# Patient Record
Sex: Female | Born: 1975 | Race: White | Hispanic: No | Smoking: Never smoker
Health system: Southern US, Community
[De-identification: ages and names within clinical notes are randomized; demographics above are authoritative.]

## PROBLEM LIST (undated history)

## (undated) DIAGNOSIS — Z524 Kidney donor: Secondary | ICD-10-CM

## (undated) DIAGNOSIS — K602 Anal fissure, unspecified: Secondary | ICD-10-CM

## (undated) DIAGNOSIS — F419 Anxiety disorder, unspecified: Secondary | ICD-10-CM

## (undated) DIAGNOSIS — F32A Depression, unspecified: Secondary | ICD-10-CM

## (undated) DIAGNOSIS — R011 Cardiac murmur, unspecified: Secondary | ICD-10-CM

## (undated) DIAGNOSIS — K589 Irritable bowel syndrome without diarrhea: Secondary | ICD-10-CM

## (undated) DIAGNOSIS — K648 Other hemorrhoids: Secondary | ICD-10-CM

## (undated) DIAGNOSIS — J45909 Unspecified asthma, uncomplicated: Secondary | ICD-10-CM

## (undated) DIAGNOSIS — G44009 Cluster headache syndrome, unspecified, not intractable: Secondary | ICD-10-CM

## (undated) DIAGNOSIS — F329 Major depressive disorder, single episode, unspecified: Secondary | ICD-10-CM

## (undated) DIAGNOSIS — Z8719 Personal history of other diseases of the digestive system: Secondary | ICD-10-CM

## (undated) HISTORY — PX: KIDNEY DONATION: SHX685

## (undated) HISTORY — DX: Unspecified asthma, uncomplicated: J45.909

## (undated) HISTORY — PX: TUBAL LIGATION: SHX77

## (undated) HISTORY — PX: CHOLECYSTECTOMY: SHX55

## (undated) HISTORY — DX: Major depressive disorder, single episode, unspecified: F32.9

## (undated) HISTORY — PX: KNEE SURGERY: SHX244

## (undated) HISTORY — DX: Kidney donor: Z52.4

## (undated) HISTORY — DX: Cardiac murmur, unspecified: R01.1

## (undated) HISTORY — DX: Irritable bowel syndrome without diarrhea: K58.9

## (undated) HISTORY — DX: Other hemorrhoids: K64.8

## (undated) HISTORY — DX: Depression, unspecified: F32.A

## (undated) HISTORY — DX: Cluster headache syndrome, unspecified, not intractable: G44.009

## (undated) HISTORY — PX: COLONOSCOPY: SHX174

## (undated) HISTORY — PX: APPENDECTOMY: SHX54

## (undated) HISTORY — DX: Anal fissure, unspecified: K60.2

## (undated) HISTORY — DX: Personal history of other diseases of the digestive system: Z87.19

---

## 1997-11-23 ENCOUNTER — Ambulatory Visit (HOSPITAL_COMMUNITY): Admission: RE | Admit: 1997-11-23 | Discharge: 1997-11-23 | Payer: Self-pay | Admitting: Obstetrics

## 1998-01-02 ENCOUNTER — Ambulatory Visit (HOSPITAL_COMMUNITY): Admission: RE | Admit: 1998-01-02 | Discharge: 1998-01-02 | Payer: Self-pay | Admitting: Obstetrics

## 1998-01-14 ENCOUNTER — Inpatient Hospital Stay (HOSPITAL_COMMUNITY): Admission: AD | Admit: 1998-01-14 | Discharge: 1998-01-14 | Payer: Self-pay | Admitting: Obstetrics

## 1998-01-30 ENCOUNTER — Other Ambulatory Visit: Admission: RE | Admit: 1998-01-30 | Discharge: 1998-01-30 | Payer: Self-pay | Admitting: *Deleted

## 1998-04-15 ENCOUNTER — Inpatient Hospital Stay (HOSPITAL_COMMUNITY): Admission: AD | Admit: 1998-04-15 | Discharge: 1998-04-15 | Payer: Self-pay | Admitting: *Deleted

## 1998-04-16 ENCOUNTER — Inpatient Hospital Stay (HOSPITAL_COMMUNITY): Admission: RE | Admit: 1998-04-16 | Discharge: 1998-04-16 | Payer: Self-pay | Admitting: Obstetrics & Gynecology

## 1998-04-30 ENCOUNTER — Inpatient Hospital Stay (HOSPITAL_COMMUNITY): Admission: AD | Admit: 1998-04-30 | Discharge: 1998-05-02 | Payer: Self-pay | Admitting: Obstetrics

## 1999-06-03 ENCOUNTER — Other Ambulatory Visit: Admission: RE | Admit: 1999-06-03 | Discharge: 1999-06-03 | Payer: Self-pay | Admitting: Gynecology

## 1999-10-30 ENCOUNTER — Inpatient Hospital Stay (HOSPITAL_COMMUNITY): Admission: AD | Admit: 1999-10-30 | Discharge: 1999-10-30 | Payer: Self-pay | Admitting: Obstetrics & Gynecology

## 1999-11-08 ENCOUNTER — Encounter: Admission: RE | Admit: 1999-11-08 | Discharge: 2000-02-06 | Payer: Self-pay | Admitting: Obstetrics & Gynecology

## 2000-01-19 ENCOUNTER — Inpatient Hospital Stay (HOSPITAL_COMMUNITY): Admission: AD | Admit: 2000-01-19 | Discharge: 2000-01-22 | Payer: Self-pay | Admitting: Obstetrics and Gynecology

## 2001-07-10 ENCOUNTER — Inpatient Hospital Stay (HOSPITAL_COMMUNITY): Admission: AD | Admit: 2001-07-10 | Discharge: 2001-07-14 | Payer: Self-pay | Admitting: *Deleted

## 2001-08-12 ENCOUNTER — Encounter: Admission: RE | Admit: 2001-08-12 | Discharge: 2001-09-22 | Payer: Self-pay | Admitting: Orthopedic Surgery

## 2002-01-07 ENCOUNTER — Encounter: Admission: RE | Admit: 2002-01-07 | Discharge: 2002-01-07 | Payer: Self-pay | Admitting: Family Medicine

## 2002-01-07 ENCOUNTER — Encounter: Payer: Self-pay | Admitting: Family Medicine

## 2002-01-19 ENCOUNTER — Ambulatory Visit (HOSPITAL_COMMUNITY): Admission: RE | Admit: 2002-01-19 | Discharge: 2002-01-19 | Payer: Self-pay | Admitting: Internal Medicine

## 2002-01-24 ENCOUNTER — Encounter: Payer: Self-pay | Admitting: *Deleted

## 2002-01-24 ENCOUNTER — Encounter: Admission: RE | Admit: 2002-01-24 | Discharge: 2002-01-24 | Payer: Self-pay | Admitting: *Deleted

## 2002-12-08 ENCOUNTER — Emergency Department (HOSPITAL_COMMUNITY): Admission: EM | Admit: 2002-12-08 | Discharge: 2002-12-08 | Payer: Self-pay | Admitting: Emergency Medicine

## 2002-12-08 ENCOUNTER — Encounter: Payer: Self-pay | Admitting: Emergency Medicine

## 2003-01-14 ENCOUNTER — Encounter: Admission: RE | Admit: 2003-01-14 | Discharge: 2003-01-14 | Payer: Self-pay | Admitting: Family Medicine

## 2003-01-14 ENCOUNTER — Encounter: Payer: Self-pay | Admitting: Family Medicine

## 2003-07-05 ENCOUNTER — Emergency Department (HOSPITAL_COMMUNITY): Admission: EM | Admit: 2003-07-05 | Discharge: 2003-07-05 | Payer: Self-pay | Admitting: Emergency Medicine

## 2005-02-06 ENCOUNTER — Emergency Department (HOSPITAL_COMMUNITY): Admission: EM | Admit: 2005-02-06 | Discharge: 2005-02-06 | Payer: Self-pay | Admitting: Emergency Medicine

## 2005-05-24 ENCOUNTER — Emergency Department (HOSPITAL_COMMUNITY): Admission: EM | Admit: 2005-05-24 | Discharge: 2005-05-25 | Payer: Self-pay | Admitting: Emergency Medicine

## 2005-06-09 ENCOUNTER — Encounter (INDEPENDENT_AMBULATORY_CARE_PROVIDER_SITE_OTHER): Payer: Self-pay | Admitting: *Deleted

## 2005-06-09 ENCOUNTER — Ambulatory Visit (HOSPITAL_COMMUNITY): Admission: RE | Admit: 2005-06-09 | Discharge: 2005-06-09 | Payer: Self-pay | Admitting: *Deleted

## 2006-01-26 ENCOUNTER — Emergency Department (HOSPITAL_COMMUNITY): Admission: EM | Admit: 2006-01-26 | Discharge: 2006-01-26 | Payer: Self-pay | Admitting: Family Medicine

## 2007-04-18 ENCOUNTER — Emergency Department (HOSPITAL_COMMUNITY): Admission: EM | Admit: 2007-04-18 | Discharge: 2007-04-18 | Payer: Self-pay | Admitting: Emergency Medicine

## 2008-01-25 ENCOUNTER — Ambulatory Visit: Payer: Self-pay | Admitting: Nurse Practitioner

## 2008-01-25 DIAGNOSIS — Z9889 Other specified postprocedural states: Secondary | ICD-10-CM | POA: Insufficient documentation

## 2008-01-25 DIAGNOSIS — N760 Acute vaginitis: Secondary | ICD-10-CM | POA: Insufficient documentation

## 2008-01-25 DIAGNOSIS — R3129 Other microscopic hematuria: Secondary | ICD-10-CM | POA: Insufficient documentation

## 2008-01-25 LAB — CONVERTED CEMR LAB
Bilirubin Urine: NEGATIVE
KOH Prep: NEGATIVE
Ketones, urine, test strip: NEGATIVE
Specific Gravity, Urine: >=1.03
Urobilinogen, UA: 0.2

## 2008-01-26 ENCOUNTER — Encounter (INDEPENDENT_AMBULATORY_CARE_PROVIDER_SITE_OTHER): Payer: Self-pay | Admitting: Nurse Practitioner

## 2008-01-28 ENCOUNTER — Telehealth (INDEPENDENT_AMBULATORY_CARE_PROVIDER_SITE_OTHER): Payer: Self-pay | Admitting: Nurse Practitioner

## 2008-02-18 ENCOUNTER — Ambulatory Visit: Payer: Self-pay | Admitting: Nurse Practitioner

## 2008-02-18 DIAGNOSIS — N739 Female pelvic inflammatory disease, unspecified: Secondary | ICD-10-CM | POA: Insufficient documentation

## 2008-02-18 LAB — CONVERTED CEMR LAB
Glucose, Urine, Semiquant: NEGATIVE
Pap Smear: NEGATIVE
Specific Gravity, Urine: 1.015
pH: 7

## 2008-02-21 ENCOUNTER — Encounter (INDEPENDENT_AMBULATORY_CARE_PROVIDER_SITE_OTHER): Payer: Self-pay | Admitting: Nurse Practitioner

## 2008-02-21 LAB — CONVERTED CEMR LAB
ALT: 14 units/L (ref 0–35)
Alkaline Phosphatase: 50 units/L (ref 39–117)
Basophils Absolute: 0 10*3/uL (ref 0.0–0.1)
Eosinophils Absolute: 0 10*3/uL (ref 0.0–0.7)
Eosinophils Relative: 0 % (ref 0–5)
Glucose, Bld: 81 mg/dL (ref 70–99)
HCT: 46.2 % — ABNORMAL HIGH (ref 36.0–46.0)
LDL Cholesterol: 98 mg/dL (ref 0–99)
MCV: 90.1 fL (ref 78.0–100.0)
Platelets: 191 10*3/uL (ref 150–400)
RDW: 13.3 % (ref 11.5–15.5)
Sodium: 141 meq/L (ref 135–145)
Total Bilirubin: 0.5 mg/dL (ref 0.3–1.2)
Total Protein: 7 g/dL (ref 6.0–8.3)
Triglycerides: 116 mg/dL (ref <150)
VLDL: 23 mg/dL (ref 0–40)

## 2008-02-24 ENCOUNTER — Ambulatory Visit: Payer: Self-pay | Admitting: Nurse Practitioner

## 2008-02-24 DIAGNOSIS — D751 Secondary polycythemia: Secondary | ICD-10-CM | POA: Insufficient documentation

## 2008-02-28 ENCOUNTER — Encounter (INDEPENDENT_AMBULATORY_CARE_PROVIDER_SITE_OTHER): Payer: Self-pay | Admitting: Nurse Practitioner

## 2008-04-19 ENCOUNTER — Ambulatory Visit: Payer: Self-pay | Admitting: Nurse Practitioner

## 2008-04-19 DIAGNOSIS — R109 Unspecified abdominal pain: Secondary | ICD-10-CM | POA: Insufficient documentation

## 2008-04-19 LAB — CONVERTED CEMR LAB
Blood in Urine, dipstick: NEGATIVE
KOH Prep: NEGATIVE
Ketones, urine, test strip: NEGATIVE
Urobilinogen, UA: 0.2

## 2008-04-24 ENCOUNTER — Encounter (INDEPENDENT_AMBULATORY_CARE_PROVIDER_SITE_OTHER): Payer: Self-pay | Admitting: Nurse Practitioner

## 2008-04-24 ENCOUNTER — Ambulatory Visit (HOSPITAL_COMMUNITY): Admission: RE | Admit: 2008-04-24 | Discharge: 2008-04-24 | Payer: Self-pay | Admitting: Family Medicine

## 2008-05-31 ENCOUNTER — Ambulatory Visit: Payer: Self-pay | Admitting: Nurse Practitioner

## 2008-05-31 DIAGNOSIS — N899 Noninflammatory disorder of vagina, unspecified: Secondary | ICD-10-CM | POA: Insufficient documentation

## 2008-05-31 DIAGNOSIS — B372 Candidiasis of skin and nail: Secondary | ICD-10-CM | POA: Insufficient documentation

## 2008-05-31 LAB — CONVERTED CEMR LAB
Bilirubin Urine: NEGATIVE
Blood in Urine, dipstick: NEGATIVE
Glucose, Urine, Semiquant: NEGATIVE
KOH Prep: NEGATIVE
Ketones, urine, test strip: NEGATIVE
WBC Urine, dipstick: NEGATIVE

## 2008-11-08 ENCOUNTER — Ambulatory Visit: Payer: Self-pay | Admitting: Nurse Practitioner

## 2008-11-08 DIAGNOSIS — S335XXA Sprain of ligaments of lumbar spine, initial encounter: Secondary | ICD-10-CM | POA: Insufficient documentation

## 2008-11-08 DIAGNOSIS — J45990 Exercise induced bronchospasm: Secondary | ICD-10-CM | POA: Insufficient documentation

## 2008-11-08 LAB — CONVERTED CEMR LAB
Bilirubin Urine: NEGATIVE
Glucose, Urine, Semiquant: NEGATIVE
KOH Prep: NEGATIVE
Ketones, urine, test strip: NEGATIVE
Specific Gravity, Urine: >=1.03

## 2009-06-15 ENCOUNTER — Encounter (INDEPENDENT_AMBULATORY_CARE_PROVIDER_SITE_OTHER): Payer: Self-pay | Admitting: Nurse Practitioner

## 2009-09-03 ENCOUNTER — Encounter: Admission: RE | Admit: 2009-09-03 | Discharge: 2009-10-24 | Payer: Self-pay | Admitting: Orthopedic Surgery

## 2010-06-23 ENCOUNTER — Emergency Department (HOSPITAL_COMMUNITY): Admission: EM | Admit: 2010-06-23 | Discharge: 2010-06-23 | Payer: Self-pay | Admitting: Emergency Medicine

## 2010-10-07 ENCOUNTER — Inpatient Hospital Stay (INDEPENDENT_AMBULATORY_CARE_PROVIDER_SITE_OTHER)
Admission: RE | Admit: 2010-10-07 | Discharge: 2010-10-07 | Disposition: A | Discharge status: Home / Self Care | Payer: Self-pay | Source: Ambulatory Visit | Attending: Emergency Medicine | Admitting: Emergency Medicine

## 2010-10-07 DIAGNOSIS — A499 Bacterial infection, unspecified: Secondary | ICD-10-CM

## 2010-10-07 DIAGNOSIS — N760 Acute vaginitis: Secondary | ICD-10-CM

## 2010-10-07 LAB — POCT URINALYSIS DIPSTICK
Protein, ur: NEGATIVE mg/dL
Urobilinogen, UA: 0.2 mg/dL (ref 0.0–1.0)
pH: 5.5 (ref 5.0–8.0)

## 2010-10-07 LAB — WET PREP, GENITAL

## 2010-10-07 LAB — POCT PREGNANCY, URINE: Preg Test, Ur: NEGATIVE

## 2010-10-10 LAB — GC/CHLAMYDIA PROBE AMP, GENITAL
Chlamydia, DNA Probe: NEGATIVE
GC Probe Amp, Genital: NEGATIVE

## 2010-10-16 LAB — URINE CULTURE: Colony Count: >=100000

## 2010-10-16 LAB — URINALYSIS, ROUTINE W REFLEX MICROSCOPIC
Bilirubin Urine: NEGATIVE
Hgb urine dipstick: NEGATIVE
Nitrite: NEGATIVE
Protein, ur: NEGATIVE mg/dL
Specific Gravity, Urine: 1.019 (ref 1.005–1.030)
Urobilinogen, UA: 0.2 mg/dL (ref 0.0–1.0)

## 2010-10-16 LAB — URINE MICROSCOPIC-ADD ON

## 2010-10-16 LAB — POCT PREGNANCY, URINE: Preg Test, Ur: NEGATIVE

## 2010-11-01 ENCOUNTER — Inpatient Hospital Stay (INDEPENDENT_AMBULATORY_CARE_PROVIDER_SITE_OTHER)
Admission: RE | Admit: 2010-11-01 | Discharge: 2010-11-01 | Disposition: A | Discharge status: Home / Self Care | Payer: Self-pay | Source: Ambulatory Visit | Attending: Family Medicine | Admitting: Family Medicine

## 2010-11-01 DIAGNOSIS — N760 Acute vaginitis: Secondary | ICD-10-CM

## 2010-11-01 DIAGNOSIS — A499 Bacterial infection, unspecified: Secondary | ICD-10-CM

## 2010-11-01 LAB — POCT URINALYSIS DIP (DEVICE)
Glucose, UA: NEGATIVE mg/dL
Hgb urine dipstick: NEGATIVE
Ketones, ur: 40 mg/dL — AB
Nitrite: NEGATIVE
Protein, ur: NEGATIVE mg/dL
Specific Gravity, Urine: >=1.03 (ref 1.005–1.030)
Urobilinogen, UA: 1 mg/dL (ref 0.0–1.0)
pH: 6 (ref 5.0–8.0)

## 2010-11-01 LAB — URINALYSIS, MICROSCOPIC ONLY
Glucose, UA: NEGATIVE mg/dL
Hgb urine dipstick: NEGATIVE
Ketones, ur: 40 mg/dL — AB
Protein, ur: NEGATIVE mg/dL
Urobilinogen, UA: 1 mg/dL (ref 0.0–1.0)

## 2010-11-01 LAB — POCT PREGNANCY, URINE: Preg Test, Ur: NEGATIVE

## 2010-12-20 NOTE — Discharge Summary (Signed)
[facility]  Patient:    Monica Stein, Monica Stein Visit Number: [id number] MRN: [id number]          Service Type: PSY Location: 500 0505 01 Attending Physician:  Jeanice Lim Dictated by:   Jeanice Lim, M.D. Admit Date:  07/10/2001 Discharge Date: 07/14/2001                             Discharge Summary  IDENTIFYING DATA:  This is a 35 year old married Caucasian female voluntarily admitted for anxiety, irritability, and feeling out of control as if she might injure people close to her.  She felt like pushing her 23-year-old child and poking the child with a vacuum cleaner.  The patient denied a history of physical abuse to her children.  PAST PSYCHIATRIC HISTORY:  Followed by Dr. Gwyndolyn Kaufman.  This is her first hospitalization to [facility].  MEDICATIONS: 1. Neurontin. 2. Effexor. 3. Doxepin.  The patient had been on Paxil and Zoloft in the past and found them ineffective.  PHYSICAL EXAMINATION:  GENERAL:  Essentially within normal limits except for obesity.  NEUROLOGIC:  Nonfocal.  ROUTINE ADMISSION LABORATORY DATA:  Essentially within normal limits including CBC, CMET, urine pregnancy test was negative.  MENTAL STATUS EXAMINATION:  The patient was an alert, young middle-aged, overweight Caucasian female, casually dressed.  Speech: Within normal limits and no pressure.  Mood: Depressed and anxious.  Affect: Sad and anxious. Thought process: Goal directed.  Thought content: Negative for psychotic symptoms, no suicidal or homicidal ideation.  Cognitive: Intact.  ADMITTING DIAGNOSES: Axis I:    Major depression, recurrent, moderate.  HOSPITAL COURSE:  The patient was restarted on psychotropics and Effexor was tapered.  Depakote was started and titrated along with Risperdal for mood lability and Neurontin was decreased.  The patient was given Bactrim for possible UTI and she tolerated medications well without side effects.  CONDITION AT DISCHARGE:   Markedly improved.  Mood was more stable, euthymic. Affect: Bright.  Thought process: Goal directed.  Thought content: Negative for psychotic symptoms or dangerous ideation.  The patient felt like she was in control and would be able to manage her behavior safely and care for her children, dealing with psychosocial stressors in a more healthy manner.  DISCHARGE MEDICATIONS: 1. Effexor XR 75 mg b.i.d. 2. Bactrim double strength b.i.d. for five days. 3. Risperdal 0.25 mg q.a.m., 3 p.m., and two q.h.s. 4. Depakote 250 mg three q.h.s. 5. Neurontin 100 mg two q.a.m., 3 p.m., and q.h.s.  [facility] December 13 at 10:30 a.m.  DISCHARGE DIAGNOSES: Axis I:    Major depression, recurrent, moderate. Axis V:    Global assessment of functioning on discharge was 55. Dictated by:   Jeanice Lim, M.D. Attending Physician:  Jeanice Lim DD:  10/13/01 TD:  10/15/01 Job: [id number]

## 2010-12-20 NOTE — Discharge Summary (Signed)
[facility]  Patient:    Monica Stein, Monica Stein                       MRN: [id number] Adm. Date:  [id number] Disc. Date: 01/22/00 Attending:  Miguel Aschoff Dictator:   Leilani Able, P.A.                           Discharge Summary  FINAL DIAGNOSES:              1. Spontaneous vaginal delivery of a term female                                  infant with Apgars of 9 and 9.                               2. Postpartum tubal ligation.  Delivery performed by Miguel Aschoff, M.D.  Postpartum tubal ligation procedure performed by Conley Simmonds, M.D.  COMPLICATIONS:                None.  HISTORY OF PRESENT ILLNESS:   This 35 year old, gravida 3, para 2-0-0-2, presents at 38 weeks complaining of decreased fetal movement.  Her strip was reactive and she was noted to be having regular contractions every three to five minutes apart.  HOSPITAL COURSE:              She is admitted at this time in the early latent phase of labor.  She progressed to complete and complete spontaneously and she pushed and spontaneously delivered a 7 pound 15 ounce female infant with Apgars 9 and 9.  Delivery went without complications.  There were no lacerations.  The patient sitll expressed her desire for a postpartum sterilization procedure.  She was posted for later on the day of June 18.  The patient was taken to the operating  room around 1:30 p.m. by Conley Simmonds, M.D. where a bilateral postpartum tubal ligation procedure was performed using the modified Pomeroy technique.  The procedure went without complications.  The patients postpartum and postoperative course were benign without significant fevers.  She was felt ready for discharge on postpartum day #2.  She was sent home on a regular diet, told to decrease activities, was given Tylox #30 one to two every four hours as needed for pain, and told she could use over-the-counter pain medicines as well and follow up in the  office  in four weeks.  DISCHARGE LABORATORY DATA:    Her hemoglobin was 12.5, white blood cell count 7.3. The patient did not need RhoGAM because infant was also rh negative. DD:  01/22/00 TD:  01/22/00 Job: [id number]

## 2010-12-20 NOTE — Op Note (Signed)
[facility]  Patient:    Monica Stein, Monica Stein                       MRN: [id number] Proc. Date: 01/20/00 Adm. Date:  [id number] Attending:  Miguel Aschoff                           Operative Report  PREOPERATIVE DIAGNOSIS:       Multiparous female, desire for permanent sterilization.  Status post normal spontaneous vaginal delivery of a viable female infant on January 20, 2000.  POSTOPERATIVE DIAGNOSIS:      Multiparous female, desire for permanent sterilization.  Status post normal spontaneous vaginal delivery of a viable female infant on January 20, 2000.  OPERATION:                    Postpartum bilateral tubal ligation by the modified Pomeroy technique.  SURGEON:                      Conley Simmonds, M.D.  ASSISTANT:  ANESTHESIA:                   General endotracheal anesthesia.  IV FLUIDS:                    1200 cc Crystalloid.  ESTIMATED BLOOD LOSS:         Minimal.  URINE OUTPUT:                 900 cc.  COMPLICATIONS:                None.  INDICATIONS:                  The patient was a 35 year old, gravida 3, para 2-0-0-2, status post spontaneous vaginal delivery of a viable female infant on une 18, 2001, who requested postpartum tubal ligation.  The patient was interested n permanent sterilization.  A decision was made to proceed with a postpartum tubal ligation after the risks and benefits were reviewed.  The patient was quoted a failure rate of approximately 1 in 250 to 1 in 300 which may result in either an intrauterine pregnancy or an ectopic pregnancy.  FINDINGS:                     Demonstrated a normal postpartum uterus.  The fallopian tubes were normal bilaterally.  The right ovary was noted to be normal, and the right ovary was not palpable nor visible.  SPECIMENS:                    A portion of the right and left fallopian tubes were sent to pathology.  DESCRIPTION OF PROCEDURE:     With an IV in place, the patient was  escorted to he operating suite after she was properly identified.  The patient did receive Ancef 1 gram intravenously for antibiotic prophylaxis.  In the operating suite, the patient was placed in the supine position and general endotracheal anesthesia was induced.  The abdomen and the perineum were sterilely prepped, and a Foley catheter was placed inside the urinary bladder.  The patient was then sterilely draped.  The procedure began with an infraumbilical transverse incision which measured approximately 3 cm.  This was carried down to the subcutaneous tissue with a scalpel.  Blunt dissection with a kelly clamp  was used to reach the fascia which was then grasped with two Kocher clamps.  The fascia was then incised in a transverse fashion.  The parietoperitoneum was grasped with two snap clamps, and the peritoneal cavity was entered sharply with the Metzenbaum scissors. Digital examination through the incision demonstrated no evidence of any intra-abdominal adhesions.  A moistened lap pad was then placed inside the peritoneal cavity, and retractors were used to identify the left fallopian tube which was grasped with a Babcock clamp.  It was followed all the way to its fimbriated end.  A knuckle of tissue was then created with a suture of 0 plain.  A snap clamp was used to come through the mesosalpinx, and an additional piece of suture was tied around the base of each  knuckle of tissue created from the 0 plain gut suture.  The intervening portion of fallopian tube was then excised and sent to pathology.  There was no evidence of any bleeding at the operative site.  The same procedure that was performed on the patients left hand side was then repeated on the right hand side.  After that fallopian tube was followed all the way to its fimbriated end.  Again hemostasis was excellent on this side.  The moistened lap pad was removed from the peritoneal cavity.  The fascia  was closed with a running suture of 0 Vicryl, and the skin as closed with a subcuticular suture of 4-0 Vicryl.  Steri-Strips  and Benzoin were placed over the incision, and this was followed by a sterile dressing.  The patient was extubated and escorted to the recovery room in stable and awake  condition.  There were no complications to the procedure.  All sponge, needle, nd instrument counts were correct. DD:  01/20/00 TD:  01/22/00 Job: [id number]

## 2010-12-20 NOTE — H&P (Signed)
[facility]  Patient:    Monica Stein, Monica Stein Visit Number: [id number] MRN: [id number]          Service Type: PSY Location: 500 0505 01 Attending Physician:  Denny Peon Dictated by:   Candi Leash. Orsini, N.P. Admit Date:  07/10/2001                     Psychiatric Admission Assessment  DATE OF ADMISSION:  July 10, 2001  PATIENT IDENTIFICATION:  This is a 35 year old married white female who was voluntarily admitted on July 10, 2001, for anxiety and irritability.  HISTORY OF PRESENT ILLNESS:  The patient presents with a history of anxiety and irritability for the past few days.  The patient, on the day of admission, reported that she was playing with her children and suddenly became very anxious and irritable.  She began to vacuum.  She was pushing her 77-year-old with the vacuum cleaner.  There was no history of injury to the baby.  She reported she had never done anything like that before.  She does state that on occasion she had taken her 35-year-old and "shaken him" when she has been upset.  She has been experiencing nightmares where she feels her middle child has died and has stuffed him in a drawer so that no one will take him.  She reports mood swings, decreased sleep, often not going to bed until 5 oclock in the morning, taking down her Christmas decorations.  Appetite has increased with some questionable weight gain.  She denied any psychosis, no suicidal or homicidal ideation.  The patient is stressed over her husbands unemployment for the past four months.  PAST PSYCHIATRIC HISTORY:  The patient sees Dr. Gwyndolyn Kaufman; her last visit was in October.  This is her first hospitalization at [facility].  She sees Champ Mungo, her therapist.  The patient has had no prior suicide attempts.  SUBSTANCE ABUSE HISTORY:  Denies any alcohol or substance abuse.  PAST MEDICAL HISTORY:  Primary care [facility]: The patient attends  [facility].  Medical problems: Exercise-induced asthma.  MEDICATIONS: 1. Neurontin 300 mg at noon and h.s. 2. Effexor XR 75 mg two in the morning and one at h.s. 3. Doxepin 75 mg q.h.s.; has been on this medication for approximately one    month.  The patient has been on Paxil and Zoloft in the past and has found it ineffective.  REVIEW OF SYSTEMS:  The patient denies any fever or chills.  Has had some increased change in appetite and some weight gain of an undetermined amount. Wears glasses, no blurred or double vision.  No hearing loss.  No sinus pain. Occasional chest pain with anxiety.  No history of hypertension or arrhythmias.  The patient is a nonsmoker, no cough or shortness of breath. GI: No change in habits, no constipation or diarrhea.  GU: No dysuria, frequency, or hematuria.  MUSCULOSKELETAL: No stiffness, swelling, or joint pain.  SKIN: No itching, wound, or rash.  No weakness, tremor, or numbness. The patient has a history of stress headaches.  PSYCHIATRIC: Recent history of depression, irritability, and anxiety.  ENDOCRINE: No thyroid or diabetic symptoms.  No enlarged or tender nodes.  No history of anemia.  No environmental allergies.  PHYSICAL EXAMINATION:  VITAL SIGNS:  The patient is 195 pounds.  She is 5 feet 6 inches tall.  Last vital signs: Temperature 99.2, pulse 89 respirations 18, blood pressure 119/77.  GENERAL:  The patient  is a 35 year old Caucasian female in no acute distress. She appears her stated age, overweight.  She is well-groomed, alert, and cooperative.  HEENT:  Head is normocephalic.  She can raise her eyebrows.  Hair is short and evenly distribution.  EOMs are intact bilaterally.  External ear canals are patent.  No sinus tenderness, no nasal discharge.  Good dentition, no lesions were seen.  Tongue protrudes to midline without tremor.  NECK:  Supple, no JVD, negative lymphadenopathy.  Thyroid is nonpalpable  and nontender.  Trachea is midline.  CHEST:  Clear to auscultation, no adventitious sounds, no cough.  CARDIOVASCULAR:  Heart is regular rate and rhythm without murmurs, rubs, or gallops.  Carotid pulses are equal and adequate.  BREAST:  Exam was deferred.  ABDOMEN:  Soft, nontender abdomen, no CVA tenderness.  MUSCULOSKELETAL:  No joint swelling or deformity.  Good range of motion. Muscle strength and tone is equal bilaterally.  No signs of injury.  SKIN:  Warm and dry with good turgor.  Nail beds are pink with good capillary refill.  Nail beds are clean.  Strong bilateral radial pulses.  NEUROLOGIC:  Oriented x 3.  Cranial nerves are grossly intact.  Good grip strength bilaterally.  No involuntary movements.  Gait is normal.  Cerebellar function is intact with heel-to-shin and normal alternating movements. Romberg is negative.  LABORATORY DATA:  CBC was within normal limits.  CMET was within normal limits.  Urine pregnancy test was negative.  Urinalysis was cloudy with many bacteria.  SOCIAL HISTORY:  She is a 35 year old married white female, married for six years.  She has three children ages 63, 31, and 1.  She lives with her husband and children.  She is a housewife.  She completed the 12th grade.  She has no legal problems.  FAMILY HISTORY:  Mother with bipolar disorder, is on medications.  MENTAL STATUS EXAMINATION:  She is an alert, young, middle-aged, overweight white female, casually dressed.  Speech is normal and relevant, not pressured. Mood is depressed and anxious.  Affect is sad and anxious.  Thought processes are coherent.  No evidence of psychosis, no auditory or visual hallucinations, no suicidal or homicidal ideations, no paranoia.  Cognitive: Intact.  Judgment is fair.  Insight is fair.  Alert and oriented x 4.  ADMISSION DIAGNOSES: Axis I:    1. Mood disorder, not otherwise specified.            2. Rule out bipolar disorder. Axis II:   Deferred. Axis  III:  Exercise-induced asthma. Axis IV:   Problems with primary support group and occupation.  Axis V:    Current is 35, estimated this past year is 37.  INITIAL PLAN OF CARE:  Plan is a voluntary admission to [facility] for depression, anxiety, irritability, and questionable obsessive thoughts.  Contract for safety.  Check every 15 minutes.  The patient agrees to be safe.  Will resume her routine medications.  Will obtain further labs. Will have a family session with her husband.  Case worker will call Child Protective Services.  Consult with Dr. Lourdes Sledge in regard to her medication regime.  Goal is to stabilize her mood and thinking so the patient can be safe, to follow up with Dr. Gwyndolyn Kaufman.  ESTIMATED LENGTH OF STAY:  Three to five days. Dictated by:   Candi Leash. Orsini, N.P. Attending Physician:  Denny Peon DD:  07/11/01 TD:  07/12/01 Job: [id number]

## 2010-12-20 NOTE — Op Note (Signed)
Monica Stein, Monica Stein                ACCOUNT NO.:  [id number]   MEDICAL RECORD NO.:  [id number]          PATIENT TYPE:  AMB   LOCATION:  DAY                          [facility]AC[facility]   PHYSICIAN:  Vikki Ports, MDDATE OF BIRTH:  1976/05/25   DATE OF PROCEDURE:  06/09/2005  DATE OF DISCHARGE:                                 OPERATIVE REPORT   PREOPERATIVE DIAGNOSIS:  Symptomatic cholelithiasis.   POSTOPERATIVE DIAGNOSIS:  Symptomatic cholelithiasis.   PROCEDURE:  Laparoscopic cholecystectomy.   SURGEON:  Vikki Ports, MD   ASSISTANT:  Lebron Conners, M.D.   ANESTHESIA:  General.   DESCRIPTION:  The patient was taken to the operating room and placed in a  supine position.  After adequate general anesthesia was induced using  endotracheal tube, the abdomen was prepped and draped in a normal sterile  fashion.  Using a small vertical supraumbilical incision, I dissected down  to the fascia and opened it vertically.  A #1 Vicryl purse-string suture was  placed around the fascial defect.  A Hasson trocar was placed in the  abdomen, and the abdomen was insufflated in continuous-flow carbon dioxide.  Under direct visualization, a 10 mm trocar was placed in the subxiphoid  region.  Two 5 mm trocars were placed in the right abdomen.  The gallbladder  was identified and retracted cephalad.  A routine dissection was performed,  identifying the cystic duct in the triangle of Calot.  The cystic duct  junction with the gallbladder and common duct were verified, and the cystic  duct was triply clipped and divided, as was the cystic artery in a similar  fashion.  The gallbladder was then taken off the gallbladder bed using Bovie  electrocautery, placed in an EndoCatch bag when there was a small hole made  in the posterior wall.  It was removed through the umbilical port.  Right  upper quadrant was copiously irrigated.  Adequate hemostasis was insured.  Pneumoperitoneum was  released.  The trocars were removed.  Fascial defect  was closed with the previously placed Vicryl sutures.  The skin incisions  were closed with subcuticular 4-0 Monocryl.  Steri-Strips and sterile  dressings were applied.  The patient tolerated the procedure well and went  to the PACU in good condition.      Vikki Ports, MD  Electronically Signed     KRH/MEDQ  D:  06/09/2005  T:  06/09/2005  Job:  [id number]

## 2010-12-20 NOTE — Discharge Summary (Signed)
[facility]  Patient:    Monica Stein, Monica Stein Visit Number: [id number] MRN: [id number]          Service Type: PSY Location: 500 0505 01 Attending Physician:  Jeanice Lim Dictated by:   Jeanice Lim, M.D. Admit Date:  07/10/2001 Discharge Date: 07/14/2001                             Discharge Summary  IDENTIFYING DATA:  This is a 35 year old married Caucasian female voluntarily admitted for anxiety and irritability and became so irritable on the day of admission she was feeling as if she may hurt her child, which she has never done before.  She began experiencing nightmares where she feels her middle child has died and has stuffed him in a drawer so that no one will take him.  PAST PSYCHIATRIC HISTORY:  The patient sees Dr. Gwyndolyn Kaufman.  MEDICATIONS:  Neurontin, Effexor, doxepin.  The patient has been on Paxil and Zoloft in the past.  PHYSICAL EXAMINATION:  Essentially within normal limits.  Neurologically nonfocal.  MENTAL STATUS EXAMINATION:  The patient was alert and oriented, casually dressed.  Speech within normal limits.  Mood depressed and anxious.  Affect sad.  Thought processes goal directed.  Thought content negative for psychotic symptoms.  She denied suicidal or homicidal ideation.  Admitted to extreme irritability with feeling of losing control at times.  Cognitively was intact. Judgment and insight were considered fair.  ADMISSION DIAGNOSES: Axis I:    Possible bipolar disorder, type 2, versus major depressive            disorder. Axis II:   None. Axis III:  Exercise-induced asthma. Axis IV:   Moderate (problems with primary support). Axis V:    35/69.  HOSPITAL COURSE:  The patient was admitted and ordered routine p.r.n. medications and titrated on Effexor and doxepin.  Depakote was started as well as Risperdal and Neurontin also adjusted.  The patient reported good tolerance to these medication changes with no side effects and  felt better.  CONDITION ON DISCHARGE:  Improved.  Mood was more stable and euthymic.  Affect brighter.  Thought process goal directed.  Thought content negative for fear of hurting her children.  No suicidal or homicidal or violent ideation nor psychotic symptoms.  Judgment and insight improved.  She reported feeling safe and was motivated to follow up with the treatment plan.  DISCHARGE MEDICATIONS: 1. Effexor XR 75 mg, 2 q.a.m. 2. Bactrim DS b.i.d. 3. Risperdal 0.25 mg q.a.m., 3 p.m. and 2 q.h.s. 4. Depakote 250 mg, 3 q.h.s. 5. Neurontin 100 mg, 2 3 p.m. and 2 q.h.s.  FOLLOW-UP:  [facility] on July 16, 2001 at 10:30 a.m.  DISCHARGE DIAGNOSES: Axis I:    Possible bipolar disorder, type 2, versus major depressive            disorder. Axis II:   None. Axis III:  Exercise-induced asthma. Axis IV:   Moderate (problems with primary support). Axis V:    Global Assessment of Functioning on discharge 55. Dictated by:   Jeanice Lim, M.D. Attending Physician:  Jeanice Lim DD:  09/15/01 TD:  09/15/01 Job: [id number]

## 2011-11-20 ENCOUNTER — Ambulatory Visit
Admission: RE | Admit: 2011-11-20 | Discharge: 2011-11-20 | Disposition: A | Discharge status: Home / Self Care | Payer: 59 | Source: Ambulatory Visit | Attending: Family Medicine | Admitting: Family Medicine

## 2011-11-20 ENCOUNTER — Other Ambulatory Visit: Payer: Self-pay | Admitting: Family Medicine

## 2011-11-20 DIAGNOSIS — R52 Pain, unspecified: Secondary | ICD-10-CM

## 2011-12-05 ENCOUNTER — Other Ambulatory Visit: Payer: Self-pay | Admitting: Family Medicine

## 2011-12-05 DIAGNOSIS — R102 Pelvic and perineal pain: Secondary | ICD-10-CM

## 2011-12-05 DIAGNOSIS — M25562 Pain in left knee: Secondary | ICD-10-CM

## 2011-12-15 ENCOUNTER — Ambulatory Visit
Admission: RE | Admit: 2011-12-15 | Discharge: 2011-12-15 | Disposition: A | Discharge status: Home / Self Care | Payer: 59 | Source: Ambulatory Visit | Attending: Family Medicine | Admitting: Family Medicine

## 2011-12-15 DIAGNOSIS — M25562 Pain in left knee: Secondary | ICD-10-CM

## 2011-12-15 DIAGNOSIS — R102 Pelvic and perineal pain: Secondary | ICD-10-CM

## 2012-12-08 ENCOUNTER — Telehealth: Payer: Self-pay | Admitting: Physician Assistant

## 2012-12-08 MED ORDER — CITALOPRAM HYDROBROMIDE 40 MG PO TABS: 40.0000 mg | ORAL_TABLET | Freq: Every day | ORAL | Status: DC

## 2012-12-08 NOTE — Telephone Encounter (Signed)
Script called out °

## 2012-12-16 ENCOUNTER — Encounter: Payer: Self-pay | Admitting: Physician Assistant

## 2012-12-16 ENCOUNTER — Ambulatory Visit (INDEPENDENT_AMBULATORY_CARE_PROVIDER_SITE_OTHER): Payer: 59 | Admitting: Physician Assistant

## 2012-12-16 VITALS — BP 110/76 | HR 80 | Temp 97.5°F | Resp 18 | Ht 66.0 in | Wt 193.0 lb

## 2012-12-16 DIAGNOSIS — L255 Unspecified contact dermatitis due to plants, except food: Secondary | ICD-10-CM

## 2012-12-16 MED ORDER — METHYLPREDNISOLONE ACETATE 80 MG/ML IJ SUSP
80.0000 mg | Freq: Once | INTRAMUSCULAR | Status: AC
  Administered 2012-12-16: 80 mg via INTRAMUSCULAR

## 2012-12-16 MED ORDER — PREDNISONE 20 MG PO TABS: ORAL_TABLET | ORAL | Status: DC

## 2012-12-16 NOTE — Progress Notes (Signed)
   Patient ID: Monica Stein MRN: [id number], DOB: January 07, 1976, 37 y.o. Date of Encounter: 12/16/2012, 11:42 AM    Chief Complaint:  Chief Complaint  Patient presents with  . Rash    both lower legs, face, neck     HPI: 37 y.o. year old female reports that she first noticed this rash Monday-4 days ago. Started on her ankles. On Tuesday she called [place] but we could not see her that day so she went to [facility]. Says the provider was only in her room 10 seconds, gave her shot, and didn't explain anything. She doesnot even know what kind of shot she got. She is here for f/u.  Now with itchy rash on both legs, and has spread to neck. Says it has continued to spread even since she got the injection 2 days ago. Very itchy.    Home Meds: See attached medication section for any medications that were entered at today's visit. The computer does not put those onto this list.The following list is a list of meds entered prior to today's visit.   Current Outpatient Prescriptions on File Prior to Visit  Medication Sig Dispense Refill  . citalopram (CELEXA) 40 MG tablet Take 1 tablet (40 mg total) by mouth daily.  30 tablet  1   No current facility-administered medications on file prior to visit.    Allergies:  Allergies  Allergen Reactions  . Hydrocodone-Acetaminophen   . Oxycodone-Acetaminophen       Review of Systems: See HPI for pertinent ROS. All other ROS negative.    Physical Exam: Blood pressure 110/76, pulse 80, temperature 97.5 F (36.4 C), temperature source Oral, resp. rate 18, height 5\' 6"  (1.676 m), weight 193 lb (87.544 kg), last menstrual period 11/29/2012., Body mass index is 31.17 kg/(m^2). General:  WNWD WF. Appears in no acute distress. Lungs: Clear bilaterally to auscultation without wheezes, rales, or rhonchi. Breathing is unlabored. Heart: Regular rhythm. No murmurs, rubs, or gallops. Msk:  Strength and tone normal for age. Skin: Bilateral feet, ankles, and lower legs with the  most areas of rash. Fewer on thighs. Some on neck. Areas affected have erythematous vessicles and papules in linear distribution.  Neuro: Alert and oriented X 3. Moves all extremities spontaneously. Gait is normal. CNII-XII grossly in tact. Psych:  Responds to questions appropriately with a normal affect.     ASSESSMENT AND PLAN:  37 y.o. year old female with  1. Allergic dermatitis due to poison vine Will give DepoMedrol 80mg  IM. Wil start oral prednisone tomorrow. Also take oral Benadryl routinely as directed. F/U if continues to spread or does not resolve at completion of prescription medication. - predniSONE (DELTASONE) 20 MG tablet; Take 3 daily for 2 days, then 2 daily for 2 days, then 1 daily for 2 days.  Dispense: 12 tablet; Refill: 0   Signed, [address] [place], [place], [facility] 12/16/2012 11:42 AM

## 2012-12-24 ENCOUNTER — Telehealth: Payer: Self-pay | Admitting: Physician Assistant

## 2012-12-24 NOTE — Telephone Encounter (Signed)
She was seen 12/16/12- Gave DepoMed 80mg  IM followed by Prednisone taper.  Does she just have itch right now or does she still have bumps/rash? If just itch, then just take Benadryl. If bumps/rash, find out what areas of skin are affected and how mubh/how bad.

## 2012-12-25 NOTE — Telephone Encounter (Signed)
Pt is aware via vm  

## 2013-03-03 ENCOUNTER — Other Ambulatory Visit: Payer: Self-pay | Admitting: Physician Assistant

## 2013-03-04 NOTE — Telephone Encounter (Signed)
Medication refilled per protocol. 

## 2013-06-08 ENCOUNTER — Ambulatory Visit (INDEPENDENT_AMBULATORY_CARE_PROVIDER_SITE_OTHER): Payer: 59 | Admitting: Family Medicine

## 2013-06-08 VITALS — BP 100/60 | HR 90 | Temp 97.1°F | Resp 18 | Ht 66.0 in | Wt 188.0 lb

## 2013-06-08 DIAGNOSIS — R21 Rash and other nonspecific skin eruption: Secondary | ICD-10-CM

## 2013-06-08 DIAGNOSIS — F411 Generalized anxiety disorder: Secondary | ICD-10-CM

## 2013-06-08 MED ORDER — CITALOPRAM HYDROBROMIDE 20 MG PO TABS: ORAL_TABLET | ORAL | Status: DC

## 2013-06-08 MED ORDER — CLOTRIMAZOLE-BETAMETHASONE 1-0.05 % EX CREA: 1.0000 "application " | TOPICAL_CREAM | Freq: Two times a day (BID) | CUTANEOUS | Status: DC

## 2013-06-08 MED ORDER — ALPRAZOLAM 0.5 MG PO TABS: 0.5000 mg | ORAL_TABLET | Freq: Two times a day (BID) | ORAL | Status: DC | PRN

## 2013-06-08 NOTE — Patient Instructions (Signed)
Restart celexa 20mg  Xanax as needed Lotrisone Cream F/U 8 weeks for medications

## 2013-06-09 ENCOUNTER — Encounter: Payer: Self-pay | Admitting: Family Medicine

## 2013-06-09 ENCOUNTER — Telehealth: Payer: Self-pay | Admitting: Family Medicine

## 2013-06-09 DIAGNOSIS — F411 Generalized anxiety disorder: Secondary | ICD-10-CM | POA: Insufficient documentation

## 2013-06-09 DIAGNOSIS — R21 Rash and other nonspecific skin eruption: Secondary | ICD-10-CM | POA: Insufficient documentation

## 2013-06-09 NOTE — Telephone Encounter (Signed)
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 Would like to touch base with her primary doctor just to make sure every one is on the same page.

## 2013-06-09 NOTE — Progress Notes (Signed)
  Subjective:    Patient ID: Monica Stein, female    DOB: 1975/09/01, 37 y.o.   MRN: [id number]  HPI  Pt here to f/u medications. She is due to have surgery next week, she will be donating a kidney to her uncle who has renal failure. She was advised to come in to PCP to have meds filled for her anxiety. She has been treated with celexa 40mg  and at times xanax due to multiple stressors and recent divorce from husband. She stopped taking celexa about 2 months ago, she would skip a few doses and then became overwhelmed and stopped. She has noticed especially with upcoming surgery increased stress and anxiety and would like to restart  Rash on buttucks past few days, +itching, no drainage, no previous lesions, no sick contacts   Review of Systems - per above  GEN- denies fatigue, fever, weight loss,weakness, recent illness HEENT- denies eye drainage, change in vision, nasal discharge, CVS- denies chest pain, palpitations RESP- denies SOB, cough, wheeze ABD- denies N/V, change in stools, abd pain GU- denies dysuria, hematuria, dribbling, incontinence MSK- denies joint pain, muscle aches, injury Neuro- denies headache, dizziness, syncope, seizure activity       Objective:   Physical Exam  GEN-NAD, alert and oriented x 3 HEENT- PERRL, EOMI, non injected sclera, Stein conjunctiva, MMM, oropharynx clear Neck- Supple, no thryomegaly CVS- RRR, no murmur RESP-CTAB Skin- Right buttocks near gluteal cleft- small dime size area of erythem with scaley appearance, 1 small papule noted, no fluctance, no abscess, no veiscles Psych- anxious appearing, not depressed, good eye contact, well groomed, no apparent hallucinations, no SI Ext- No edema Pulse- radial 2+      Assessment & Plan:

## 2013-06-09 NOTE — Assessment & Plan Note (Signed)
Restart celexa at 20mg  Given short course of benzo for prn

## 2013-06-09 NOTE — Assessment & Plan Note (Signed)
Rash appears to be resolving based on location, will treat with lotrisone

## 2013-06-10 NOTE — Telephone Encounter (Signed)
Called and left message for SW [place]

## 2013-06-10 NOTE — Telephone Encounter (Signed)
Spoke with SW, she gave me update on process of surgery/donor and follow-up. They wanted to make sure she had made contact about her anxiety, I gave them her new medication doses

## 2013-08-21 ENCOUNTER — Emergency Department (HOSPITAL_COMMUNITY)
Admission: EM | Admit: 2013-08-21 | Discharge: 2013-08-21 | Disposition: A | Discharge status: Home / Self Care | Payer: 59 | Attending: Emergency Medicine | Admitting: Emergency Medicine

## 2013-08-21 ENCOUNTER — Encounter (HOSPITAL_COMMUNITY): Payer: Self-pay | Admitting: Emergency Medicine

## 2013-08-21 ENCOUNTER — Emergency Department (HOSPITAL_COMMUNITY): Payer: 59

## 2013-08-21 DIAGNOSIS — F411 Generalized anxiety disorder: Secondary | ICD-10-CM | POA: Insufficient documentation

## 2013-08-21 DIAGNOSIS — R209 Unspecified disturbances of skin sensation: Secondary | ICD-10-CM | POA: Insufficient documentation

## 2013-08-21 DIAGNOSIS — M549 Dorsalgia, unspecified: Secondary | ICD-10-CM | POA: Insufficient documentation

## 2013-08-21 DIAGNOSIS — Y9389 Activity, other specified: Secondary | ICD-10-CM | POA: Insufficient documentation

## 2013-08-21 DIAGNOSIS — Y92410 Unspecified street and highway as the place of occurrence of the external cause: Secondary | ICD-10-CM | POA: Insufficient documentation

## 2013-08-21 DIAGNOSIS — M542 Cervicalgia: Secondary | ICD-10-CM | POA: Insufficient documentation

## 2013-08-21 DIAGNOSIS — Z79899 Other long term (current) drug therapy: Secondary | ICD-10-CM | POA: Insufficient documentation

## 2013-08-21 HISTORY — DX: Anxiety disorder, unspecified: F41.9

## 2013-08-21 LAB — CBC WITH DIFFERENTIAL/PLATELET
BASOS PCT: 0 % (ref 0–1)
Basophils Absolute: 0 10*3/uL (ref 0.0–0.1)
EOS ABS: 0 10*3/uL (ref 0.0–0.7)
EOS PCT: 1 % (ref 0–5)
HEMATOCRIT: 39.3 % (ref 36.0–46.0)
HEMOGLOBIN: 14.1 g/dL (ref 12.0–15.0)
Lymphocytes Relative: 33 % (ref 12–46)
Lymphs Abs: 1.8 10*3/uL (ref 0.7–4.0)
MCH: 29.7 pg (ref 26.0–34.0)
MCHC: 35.9 g/dL (ref 30.0–36.0)
MCV: 82.7 fL (ref 78.0–100.0)
MONO ABS: 0.6 10*3/uL (ref 0.1–1.0)
MONOS PCT: 11 % (ref 3–12)
Neutro Abs: 3 10*3/uL (ref 1.7–7.7)
Neutrophils Relative %: 56 % (ref 43–77)
Platelets: 157 10*3/uL (ref 150–400)
RBC: 4.75 MIL/uL (ref 3.87–5.11)
RDW: 12.4 % (ref 11.5–15.5)
WBC: 5.4 10*3/uL (ref 4.0–10.5)

## 2013-08-21 LAB — COMPREHENSIVE METABOLIC PANEL
ALBUMIN: 3.9 g/dL (ref 3.5–5.2)
ALT: 16 U/L (ref 0–35)
AST: 16 U/L (ref 0–37)
Alkaline Phosphatase: 72 U/L (ref 39–117)
BUN: 18 mg/dL (ref 6–23)
CO2: 25 mEq/L (ref 19–32)
CREATININE: 1.05 mg/dL (ref 0.50–1.10)
Calcium: 9.4 mg/dL (ref 8.4–10.5)
Chloride: 103 mEq/L (ref 96–112)
GFR calc Af Amer: 77 mL/min — ABNORMAL LOW (ref >90)
GFR calc non Af Amer: 66 mL/min — ABNORMAL LOW (ref >90)
Glucose, Bld: 97 mg/dL (ref 70–99)
Potassium: 4.2 mEq/L (ref 3.7–5.3)
Sodium: 143 mEq/L (ref 137–147)
TOTAL PROTEIN: 7.1 g/dL (ref 6.0–8.3)
Total Bilirubin: <0.2 mg/dL — ABNORMAL LOW (ref 0.3–1.2)

## 2013-08-21 LAB — LIPASE, BLOOD: LIPASE: 25 U/L (ref 11–59)

## 2013-08-21 MED ORDER — IBUPROFEN 800 MG PO TABS
800.0000 mg | ORAL_TABLET | Freq: Once | ORAL | Status: AC
  Administered 2013-08-21: 800 mg via ORAL
  Filled 2013-08-21: qty 1

## 2013-08-21 MED ORDER — SODIUM CHLORIDE 0.9 % IV BOLUS (SEPSIS)
1000.0000 mL | Freq: Once | INTRAVENOUS | Status: AC
  Administered 2013-08-21: 1000 mL via INTRAVENOUS
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
## 2013-08-21 NOTE — Discharge Instructions (Signed)
Take motrin 800mg  3 times a day.  Return for numbness/tingling of your arms.  Your pain will be worse tomorrow.  Musculoskeletal Pain Musculoskeletal pain is muscle and boney aches and pains. These pains can occur in any part of the body. Your caregiver may treat you without knowing the cause of the pain. They may treat you if blood or urine tests, X-rays, and other tests were normal.  CAUSES There is often not a definite cause or reason for these pains. These pains may be caused by a type of germ (virus). The discomfort may also come from overuse. Overuse includes working out too hard when your body is not fit. Boney aches also come from weather changes. Bone is sensitive to atmospheric pressure changes. HOME CARE INSTRUCTIONS   Ask when your test results will be ready. Make sure you get your test results.  Only take over-the-counter or prescription medicines for pain, discomfort, or fever as directed by your caregiver. If you were given medications for your condition, do not drive, operate machinery or power tools, or sign legal documents for 24 hours. Do not drink alcohol. Do not take sleeping pills or other medications that may interfere with treatment.  Continue all activities unless the activities cause more pain. When the pain lessens, slowly resume normal activities. Gradually increase the intensity and duration of the activities or exercise.  During periods of severe pain, bed rest may be helpful. Lay or sit in any position that is comfortable.  Putting ice on the injured area.  Put ice in a bag.  Place a towel between your skin and the bag.  Leave the ice on for 15 to 20 minutes, 3 to 4 times a day.  Follow up with your caregiver for continued problems and no reason can be found for the pain. If the pain becomes worse or does not go away, it may be necessary to repeat tests or do additional testing. Your caregiver may need to look further for a possible cause. SEEK IMMEDIATE MEDICAL  CARE IF:  You have pain that is getting worse and is not relieved by medications.  You develop chest pain that is associated with shortness or breath, sweating, feeling sick to your stomach (nauseous), or throw up (vomit).  Your pain becomes localized to the abdomen.  You develop any new symptoms that seem different or that concern you. MAKE SURE YOU:   Understand these instructions.  Will watch your condition.  Will get help right away if you are not doing well or get worse. Document Released: 07/21/2005 Document Revised: 10/13/2011 Document Reviewed: 03/25/2013 [facility] Patient Information 2014 [employer] [place].

## 2013-08-21 NOTE — ED Notes (Signed)
Patient returned from xray.

## 2013-08-22 ENCOUNTER — Telehealth: Payer: Self-pay | Admitting: Family Medicine

## 2013-08-22 ENCOUNTER — Ambulatory Visit (INDEPENDENT_AMBULATORY_CARE_PROVIDER_SITE_OTHER): Payer: 59 | Admitting: Family Medicine

## 2013-08-22 ENCOUNTER — Encounter: Payer: Self-pay | Admitting: Family Medicine

## 2013-08-22 DIAGNOSIS — M179 Osteoarthritis of knee, unspecified: Secondary | ICD-10-CM

## 2013-08-22 DIAGNOSIS — IMO0002 Reserved for concepts with insufficient information to code with codable children: Secondary | ICD-10-CM

## 2013-08-22 DIAGNOSIS — R894 Abnormal immunological findings in specimens from other organs, systems and tissues: Secondary | ICD-10-CM

## 2013-08-22 DIAGNOSIS — F411 Generalized anxiety disorder: Secondary | ICD-10-CM

## 2013-08-22 DIAGNOSIS — S134XXA Sprain of ligaments of cervical spine, initial encounter: Secondary | ICD-10-CM

## 2013-08-22 DIAGNOSIS — R768 Other specified abnormal immunological findings in serum: Secondary | ICD-10-CM

## 2013-08-22 DIAGNOSIS — M549 Dorsalgia, unspecified: Secondary | ICD-10-CM

## 2013-08-22 DIAGNOSIS — M5410 Radiculopathy, site unspecified: Secondary | ICD-10-CM | POA: Insufficient documentation

## 2013-08-22 DIAGNOSIS — M1710 Unilateral primary osteoarthritis, unspecified knee: Secondary | ICD-10-CM

## 2013-08-22 DIAGNOSIS — S139XXA Sprain of joints and ligaments of unspecified parts of neck, initial encounter: Secondary | ICD-10-CM

## 2013-08-22 DIAGNOSIS — M2550 Pain in unspecified joint: Secondary | ICD-10-CM

## 2013-08-22 DIAGNOSIS — Z9889 Other specified postprocedural states: Secondary | ICD-10-CM

## 2013-08-22 LAB — RHEUMATOID FACTOR: Rhuematoid fact SerPl-aCnc: 26 IU/mL — ABNORMAL HIGH (ref <=14)

## 2013-08-22 MED ORDER — CYCLOBENZAPRINE HCL 10 MG PO TABS: 10.0000 mg | ORAL_TABLET | Freq: Three times a day (TID) | ORAL | Status: DC | PRN

## 2013-08-22 MED ORDER — ALPRAZOLAM 0.5 MG PO TABS: 0.5000 mg | ORAL_TABLET | Freq: Two times a day (BID) | ORAL | Status: DC | PRN

## 2013-08-22 MED ORDER — CITALOPRAM HYDROBROMIDE 40 MG PO TABS: 40.0000 mg | ORAL_TABLET | Freq: Every day | ORAL | Status: DC
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
## 2013-08-22 NOTE — Patient Instructions (Signed)
Call back with the name of the pain medication Start muscle relaxer  Celexa increased to 40mg  Continue xanax as needed F/U 6 weeks for medications

## 2013-08-22 NOTE — Assessment & Plan Note (Signed)
Increase celexa to 40mg  Continue xanax
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
## 2013-08-22 NOTE — Assessment & Plan Note (Signed)
Patient requests a referral to Dr. pill was also seen her mother at [facility] [place]
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
## 2013-08-22 NOTE — Telephone Encounter (Signed)
Pt called back and said that the medication is called Dilaudid  Call back number [phone]

## 2013-08-23 LAB — ANA: ANA: POSITIVE — AB

## 2013-08-23 LAB — ANTI-NUCLEAR AB-TITER (ANA TITER): ANA TITER 1: NEGATIVE (ref <1:40)

## 2013-08-24 MED ORDER — HYDROMORPHONE HCL 2 MG PO TABS: 2.0000 mg | ORAL_TABLET | Freq: Four times a day (QID) | ORAL | Status: DC | PRN

## 2013-08-24 NOTE — Addendum Note (Signed)
Addended by: Milinda AntisURHAM, Marquis Down F on: 08/24/2013 08:03 AM   Modules accepted: Orders

## 2013-08-24 NOTE — Telephone Encounter (Signed)
Please let pt know I will give her a refill on 20 tablets of the pain meds Continue muscle relaxers

## 2013-08-25 ENCOUNTER — Encounter: Payer: Self-pay | Admitting: *Deleted

## 2013-08-26 NOTE — Telephone Encounter (Signed)
LMTRC

## 2013-08-29 NOTE — Telephone Encounter (Signed)
Pt called back and will come int to pick up her meds today

## 2013-08-30 ENCOUNTER — Encounter: Payer: Self-pay | Admitting: *Deleted

## 2013-08-30 ENCOUNTER — Telehealth: Payer: Self-pay | Admitting: *Deleted

## 2013-08-30 NOTE — Telephone Encounter (Signed)
Letter written for light duty, left message for pt to return my call.

## 2013-08-30 NOTE — Telephone Encounter (Signed)
Pt can come and pick up the letter
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
## 2013-10-03 ENCOUNTER — Ambulatory Visit: Payer: 59 | Admitting: Family Medicine

## 2013-11-14 ENCOUNTER — Ambulatory Visit (INDEPENDENT_AMBULATORY_CARE_PROVIDER_SITE_OTHER): Payer: 59 | Admitting: Family Medicine

## 2013-11-14 ENCOUNTER — Encounter: Payer: Self-pay | Admitting: Family Medicine

## 2013-11-14 VITALS — BP 128/76 | HR 78 | Temp 98.6°F | Resp 16 | Ht 66.0 in | Wt 213.0 lb

## 2013-11-14 DIAGNOSIS — F411 Generalized anxiety disorder: Secondary | ICD-10-CM

## 2013-11-14 DIAGNOSIS — K5900 Constipation, unspecified: Secondary | ICD-10-CM

## 2013-11-14 DIAGNOSIS — M2550 Pain in unspecified joint: Secondary | ICD-10-CM

## 2013-11-14 DIAGNOSIS — K5909 Other constipation: Secondary | ICD-10-CM

## 2013-11-14 DIAGNOSIS — K649 Unspecified hemorrhoids: Secondary | ICD-10-CM

## 2013-11-14 MED ORDER — CYCLOBENZAPRINE HCL 10 MG PO TABS: 10.0000 mg | ORAL_TABLET | Freq: Three times a day (TID) | ORAL | Status: DC | PRN

## 2013-11-14 MED ORDER — ALPRAZOLAM 0.5 MG PO TABS: 0.5000 mg | ORAL_TABLET | Freq: Two times a day (BID) | ORAL | Status: DC | PRN

## 2013-11-14 MED ORDER — HYDROCORTISONE 2.5 % RE CREA: 1.0000 "application " | TOPICAL_CREAM | Freq: Two times a day (BID) | RECTAL | Status: DC

## 2013-11-14 NOTE — Assessment & Plan Note (Signed)
+   RF, she will go have Xrays done,may need rheumatology referral
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
## 2013-11-14 NOTE — Assessment & Plan Note (Signed)
Trial of Amitiza 24 mg twice a day 

## 2013-11-14 NOTE — Assessment & Plan Note (Signed)
Continue celexa and prn xanax which she rarely uses

## 2013-11-14 NOTE — Assessment & Plan Note (Signed)
Anusol HC given sitz bath

## 2013-11-14 NOTE — Patient Instructions (Addendum)
Continue medications Get the xray of the hand/wrist Continue xanax  Increase water and fiber Amitiza for constipation twice a day  Use suppository and sitz bath for hemorrhoids Call if Amitiza helps and script can be sent F/U 4 months

## 2013-11-24 ENCOUNTER — Telehealth: Payer: Self-pay | Admitting: Family Medicine

## 2013-11-24 NOTE — Telephone Encounter (Signed)
Call returned from patient.   States that she has spoken with Ortho about FMLA forms, but surgeon Dr. Corinna CapraPill, states that since he is not sure if she will need a full or partial replacement, he cannot complete FMLA forms until her scheduled surgery on 12/15/2013.  Patient states that she is having difficulty walking and going up and down stairs, which is part of her job description, due to the pain in her knees and back.   Requested to have MD file forms until scheduled time of surgery, when Dr. Corinna CapraPill can take over.   MD please advise.

## 2013-11-24 NOTE — Telephone Encounter (Signed)
LMTRC

## 2013-11-24 NOTE — Telephone Encounter (Signed)
Please call pt, it is my understanding she is already on light duty [facility] but this will have to be completed by ortho if they take her out for work due to surgery. If she wants to go out earlier Ortho has to take her out based on their recommendations, this is why I sent her to the specialist.   She will either have to take personal time or continue at light duty.

## 2013-11-25 NOTE — Telephone Encounter (Signed)
This needs to happen through ortho I will not complete forms for her. This is why she is seeing specialist

## 2013-11-25 NOTE — Telephone Encounter (Signed)
Call placed to patient and patient made aware.   Blank forms left at front desk for patient to pick up.

## 2013-12-24 ENCOUNTER — Other Ambulatory Visit: Payer: Self-pay | Admitting: Family Medicine

## 2013-12-27 ENCOUNTER — Encounter: Payer: Self-pay | Admitting: Family Medicine

## 2013-12-27 ENCOUNTER — Ambulatory Visit (INDEPENDENT_AMBULATORY_CARE_PROVIDER_SITE_OTHER): Payer: 59 | Admitting: Family Medicine

## 2013-12-27 VITALS — BP 130/68 | HR 76 | Temp 98.5°F | Resp 16 | Ht 66.0 in | Wt 222.0 lb

## 2013-12-27 DIAGNOSIS — M179 Osteoarthritis of knee, unspecified: Secondary | ICD-10-CM

## 2013-12-27 DIAGNOSIS — M1710 Unilateral primary osteoarthritis, unspecified knee: Secondary | ICD-10-CM

## 2013-12-27 DIAGNOSIS — K5900 Constipation, unspecified: Secondary | ICD-10-CM

## 2013-12-27 DIAGNOSIS — K649 Unspecified hemorrhoids: Secondary | ICD-10-CM

## 2013-12-27 DIAGNOSIS — K5909 Other constipation: Secondary | ICD-10-CM

## 2013-12-27 DIAGNOSIS — IMO0002 Reserved for concepts with insufficient information to code with codable children: Secondary | ICD-10-CM

## 2013-12-27 DIAGNOSIS — F411 Generalized anxiety disorder: Secondary | ICD-10-CM

## 2013-12-27 MED ORDER — LUBIPROSTONE 24 MCG PO CAPS: 24.0000 ug | ORAL_CAPSULE | Freq: Two times a day (BID) | ORAL | Status: DC

## 2013-12-27 NOTE — Assessment & Plan Note (Signed)
Status post arthroscopic and clean out: Obtain her records. If she still has difficulty getting these filled out I will assist her in this now that we have records from her surgeon

## 2013-12-27 NOTE — Patient Instructions (Addendum)
Release of records- Dr. Corinna Capra - orthopedics  Referral to GI  Continue amitiza  F/U 4 months
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
## 2013-12-27 NOTE — Assessment & Plan Note (Signed)
Continue current medications. 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

## 2013-12-27 NOTE — Assessment & Plan Note (Signed)
We will continue the Amitiza which is working well for her P. her

## 2013-12-27 NOTE — Telephone Encounter (Signed)
Refill appropriate and filled per protocol. 

## 2013-12-27 NOTE — Assessment & Plan Note (Signed)
She's not respond well to over-the-counter or prescription medications I will send her to gastroenterology she may benefit from the hemorrhoid banding

## 2013-12-28 ENCOUNTER — Telehealth: Payer: Self-pay | Admitting: *Deleted

## 2013-12-28 NOTE — Telephone Encounter (Signed)
Received fax from pharmacy requesting PA for Amitiza.   PA submitted.

## 2014-01-16 NOTE — Telephone Encounter (Signed)
Call placed to inquire about the status of PA.   PA re-submitted.   

## 2014-01-30 ENCOUNTER — Encounter: Payer: Self-pay | Admitting: Family Medicine

## 2014-04-13 ENCOUNTER — Emergency Department (HOSPITAL_COMMUNITY)
Admission: EM | Admit: 2014-04-13 | Discharge: 2014-04-13 | Disposition: A | Discharge status: Home / Self Care | Payer: 59 | Attending: Emergency Medicine | Admitting: Emergency Medicine

## 2014-04-13 ENCOUNTER — Encounter (HOSPITAL_COMMUNITY): Payer: Self-pay | Admitting: Emergency Medicine

## 2014-04-13 DIAGNOSIS — Z79899 Other long term (current) drug therapy: Secondary | ICD-10-CM | POA: Insufficient documentation

## 2014-04-13 DIAGNOSIS — R519 Headache, unspecified: Secondary | ICD-10-CM

## 2014-04-13 DIAGNOSIS — R51 Headache: Secondary | ICD-10-CM | POA: Insufficient documentation

## 2014-04-13 DIAGNOSIS — F411 Generalized anxiety disorder: Secondary | ICD-10-CM | POA: Insufficient documentation

## 2014-04-13 DIAGNOSIS — R52 Pain, unspecified: Secondary | ICD-10-CM | POA: Insufficient documentation

## 2014-04-13 DIAGNOSIS — IMO0002 Reserved for concepts with insufficient information to code with codable children: Secondary | ICD-10-CM | POA: Insufficient documentation

## 2014-04-13 MED ORDER — IBUPROFEN 800 MG PO TABS: 800.0000 mg | ORAL_TABLET | Freq: Three times a day (TID) | ORAL | Status: AC

## 2014-04-13 MED ORDER — DEXAMETHASONE SODIUM PHOSPHATE 10 MG/ML IJ SOLN
10.0000 mg | Freq: Once | INTRAMUSCULAR | Status: AC
  Administered 2014-04-13: 10 mg via INTRAVENOUS
  Filled 2014-04-13: qty 1

## 2014-04-13 MED ORDER — DIAZEPAM 5 MG PO TABS
5.0000 mg | ORAL_TABLET | Freq: Once | ORAL | Status: AC
  Administered 2014-04-13: 5 mg via ORAL
  Filled 2014-04-13: qty 1

## 2014-04-13 MED ORDER — KETOROLAC TROMETHAMINE 30 MG/ML IJ SOLN
30.0000 mg | Freq: Once | INTRAMUSCULAR | Status: AC
  Administered 2014-04-13: 30 mg via INTRAVENOUS
  Filled 2014-04-13: qty 1

## 2014-04-13 MED ORDER — SODIUM CHLORIDE 0.9 % IV SOLN
Freq: Once | INTRAVENOUS | Status: AC
  Administered 2014-04-13: 23:00:00 via INTRAVENOUS

## 2014-04-13 MED ORDER — DIAZEPAM 5 MG PO TABS: 5.0000 mg | ORAL_TABLET | Freq: Two times a day (BID) | ORAL | Status: DC | PRN

## 2014-04-13 NOTE — Discharge Instructions (Signed)
As discussed it is important that you follow up with her primary care physician for appropriate ongoing management of your headache. Return here for concerning changes in your condition. If you are unable to follow up with your physician, please use the provided resource guide to obtain a new one.   Emergency Department Resource Guide 1) Find a Doctor and Pay Out of Pocket Although you won't have to find out who is covered by your insurance plan, it is a good idea to ask around and get recommendations. You will then need to call the office and see if the doctor you have chosen will accept you as a new patient and what types of options they offer for patients who are self-pay. Some doctors offer discounts or will set up payment plans for their patients who do not have insurance, but you will need to ask so you aren't surprised when you get to your appointment.  2) Contact Your Local Health Department Not all health departments have doctors that can see patients for sick visits, but many do, so it is worth a call to see if yours does. If you don't know where your local health department is, you can check in your phone book. The CDC also has a tool to help you locate your state's health department, and many state websites also have listings of all of their local health departments.  3) Find a Walk-in Clinic If your illness is not likely to be very severe or complicated, you may want to try a walk in clinic. These are popping up all over the country in pharmacies, drugstores, and shopping centers. They're usually staffed by nurse practitioners or physician assistants that have been trained to treat common illnesses and complaints. They're usually fairly quick and inexpensive. However, if you have serious medical issues or chronic medical problems, these are probably not your best option.  No Primary Care Doctor: - Call Health Connect at  [phone] - they can help you locate a primary care doctor that   accepts your insurance, provides certain services, etc. - Physician Referral Service- [phone]  Chronic Pain Problems: Organization         Address  Phone   Notes  [facility]  [phone] Patients need to be referred by their primary care doctor.   Medication Assistance: Organization         Address  Phone   Notes  [facility] Medication [facility] [address] [place] [place], [place] [address] [phone] --Must be a resident of [facility] -- Must have NO insurance coverage whatsoever (no Medicaid/ Medicare, etc.) -- The pt. MUST have a primary care doctor that directs their care regularly and follows them in the community   [facility]ist  [phone]   [employer]  [phone]    Agencies that provide inexpensive medical care: Organization         Address  Phone   Notes  [facility]  [phone]   [facility]    [phone]   [facility] [address] [place], [place] [address] [phone]   [facility] [address], [place] [phone]   Planned Parenthood    [phone]   [facility]    [phone]   [facility]  [address], [place] Phone:  [phone], Fax:  [phone] Hours of Operation:  9  am - 6 pm, M-F.  Also accepts Medicaid/Medicare and self-pay.  [facility] for [place], [address], [place] Phone: [phone], Fax: [phone]. Hours of Operation:  8:30 am - 5:30 pm, M-F.  Also accepts Medicaid and self-pay.  [facility] [address] [place] Phone: [phone]   [place], [place], [place] [phone], Ext. [phone] Mondays & Thursdays: 7-9 AM.  First 15 patients are seen on a first come, first serve basis.    [place] Providers:  Organization          Address  Phone   Notes  [facility] [address], [place] [phone] Also accepts self-pay patients.  [facility] [address], [place]  [phone]   [place], [address], [place] [phone]   [facility] [address], [place] [phone]   Lucianne Lei [address], [place]   [phone] Only accepts [place] Access [place] patients after they have their name applied to their card.   Self-Pay (no insurance) in [facility]:  Organization         Address  Phone   Notes  Sickle Cell Patients, [facility] [place] [phone]   [facility] [place] [phone]   [facility] [place]  [place], [place], [place] [phone]   [facility]/Dr. Osei-Bonsu  [address], [place] or [address], [place] [phone] Phone number for both [place] and [place] locations is the same.  [facility]gent [facility] and [facility] [address], [place] [phone]   [facility] [address], [place] or [address] [phone]   [facility] [address] [place] [phone], phone; [phone], fax Sees patients 1st and 3rd Saturday of every month.  Must not qualify for public or private insurance (i.e. Medicaid, Medicare, [place] Health Choice, Veterans' Benefits)  Household income should be no more than 200% of the poverty level The clinic cannot treat you if you are pregnant or think you are pregnant  Sexually transmitted diseases are not treated at the clinic.   Dental Care: Organization         Address  Phone  Notes  [facility] Department of [facility] [place] [phone] Accepts children up to age 33 who are enrolled in [place] or [place]; pregnant women with a Medicaid card; and children who have applied for Medicaid or [place] Health Choice, but were declined, whose parents can pay a reduced fee at time of service.  [facility] Department of [facility]  [address], [place] [phone] Accepts children up to age 32 who are enrolled in [place] or [place]; pregnant women with a Medicaid card; and children who have applied for Medicaid or [place] Health Choice, but were declined, whose parents can pay a reduced fee at time of service.  [place] [facility] Access PROGRAM  [place] [phone] Patients are seen by appointment only. Walk-ins are not accepted. [facility] will see patients 78 years of age  and older. Monday - Tuesday (8am-5pm) Most Wednesdays (8:30-5pm) $30 per visit, cash only  [facility] Adult Dental Access PROGRAM  [address], [facility] [phone] Patients are seen by appointment only. Walk-ins are not accepted. [facility] will see patients 39 years of age and older. One Wednesday Evening (Monthly: Volunteer Based).  $30 per visit, cash only  [employer]  [phone] for adults; Children under age 62, call Graduate Pediatric Dentistry at [phone]. Children aged 49-14, please call [phone] to request a pediatric application.  Dental services are provided in all areas of dental care including fillings, crowns and bridges, complete and partial dentures, implants, gum treatment, root canals, and extractions. Preventive care is also provided. Treatment is provided to both adults and children. Patients are selected via a lottery and there is often a waiting list.   [facility] [address], [place]  [phone] [email or web address]   [facility] [address] [place], [place]  [phone], Ext. [phone] Second and Fourth Thursday of each month, opens at 6:30 AM; Clinic ends at 9 AM.  Patients are seen on a first-come first-served basis, and a limited number are seen during each clinic.   [facility]  [address] [place], [place] [phone]   Eligibility Requirements You must have lived in [place], [place], or [place] counties for at least the last three months.   You cannot be eligible for state or federal sponsored [place], including [employer], [place], or [employer].   You generally cannot be eligible for healthcare insurance through your employer.    How to apply: Eligibility screenings are held every Tuesday and Wednesday afternoon from 1:00 pm until 4:00 pm. You do not need an appointment for the interview!  [facility] [address], [place], [place] [phone]   [facility] Health Department  [phone]   [facility]  [phone]   [facility] Health Department  [phone]    Behavioral Health Resources in the Community: Intensive Outpatient Programs Organization         Address  Phone  Notes  [facility] [address], [place], [place] [phone]   [facility] [address], [place], [place] [phone]   ADS: [facility] & Drug Sv[facility] [address] [place], [place]  [phone]   [facility] Mental Health [address]  [place], [place] [phone] or [phone]   Substance Abuse Resources Organization         Address  Phone  Notes  Al[facility] Drug Services  [phone]   [facility]  [phone]   [facility] [place]  [phone]   Floydene Flock  [phone]   Residential & Outpatient Substance Abuse Program  [phone]   Psychological Services Organization         Address  Phone  Notes  [facility] Health  [phone]   [facility] Services  [phone]    [facility] Mental Health [address] [place] [phone] or [phone]    Mobile Crisis Teams Organization         Address  Phone  Notes  [facility], Mobile Crisis Care Unit  [phone]   [facility]  [address] [place], [place] [phone]   [facility] [place] [address] [place] [phone]    Self-Help/Support Groups Organization         Address  Phone             Notes  [facility] [place] - variety of support groups  [place] Call for more information  Narcotics Anonymous (NA), [facility] [address], [employer] [place]  2 meetings at this location   Special educational needs teacher         Address  Phone  Notes  [facility] Residential Treatment [place],    [place]  [phone]   [facility]  [address] [place] [address], [place], [place]   [place], [place] [phone] Admissions: 8am-3pm M-F  Incentives Substance [place] [address]    [place], [place] [phone]   [facility] [address] [place], [place], [place]   [facility] [address],  [place], [place]   [facility] - Intensive Outpatient [address], [place], [place]   [facility] [place].,  [place], [place] [phone] or [phone]   [facility] Services (RTS) [address] [place], [place] Accepts Medicaid  [facility] [address],  [place] [phone] Substance Abuse/Addiction Treatment   [facility] Organization         Address  Phone  Notes  [facility]  [phone]   Domenic Schwab, PhD [address] [place], [place]   [phone] or [phone]   [place]  [place], [place] [phone]   [facility] [address], [place], [place] [phone] Insurance/Medicaid/sponsorship through [facility] and Families [address]                                    [place], [place] [phone] [place] [address] [place], [place] [phone]    Dr. Adele Schilder  [phone]   [facility] Dept. 1) [address], [place] 2) [place] 3)  [place] [address], [place] [phone]  [phone]   [place] [phone] or [phone] (After Hours)
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
## 2014-04-13 NOTE — ED Provider Notes (Signed)
CSN: [id number]     Arrival date & time 04/13/14  1904 History   First MD Initiated Contact with Patient 04/13/14 2113     Chief Complaint  Patient presents with  . Headache      HPI  Patient presents with headache purulent patient has a headache for approximately 8 months, during which time she has seen physicians, had prior medical management, but currently patient has no job, no money for medicine. Patient currently complains of pain throughout the posterior neck, occiput, worse with motion. No new confusion, disorientation, asymmetric weakness, fevers, chills, other complaints. Currently no relief with OTC medication. Patient denies any changes in the characteristics of the pain, notes that the severity is persistent, irritated.  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 Anxiety    Past Surgical History  Procedure Laterality Date  . Knee surgery    . Cholecystectomy    . Tubal ligation    . Kidney donation     No family history on file. History  Substance Use Topics  . Smoking status: Never Smoker   . Smokeless tobacco: Never Used  . Alcohol Use: Yes     Comment: occasionally   OB History   Grav Para Term Preterm Abortions TAB SAB Ect Mult Living                 Review of Systems  Constitutional:       Per HPI, otherwise negative  HENT:       Per HPI, otherwise negative  Respiratory:       Per HPI, otherwise negative  Cardiovascular:       Per HPI, otherwise negative  Gastrointestinal: Negative for vomiting.  Endocrine:       Negative aside from HPI  Genitourinary:       Neg aside from HPI   Musculoskeletal:       Per HPI, otherwise negative  Skin: Negative.   Neurological: Positive for headaches. Negative for syncope.      Allergies  Hydrocodone-acetaminophen and Oxycodone-acetaminophen  Home Medications   Prior to Admission medications    Medication Sig Start Date End Date Taking? Authorizing Provider  acetaminophen (TYLENOL) 500 MG tablet Take 1,000 mg by mouth every 6 (six) hours as needed.    Historical Provider, MD  ALPRAZolam Prudy Feeler) 0.5 MG tablet Take 1 tablet (0.5 mg total) by mouth 2 (two) times daily as needed for anxiety. 11/14/13   Salley Scarlet, MD  citalopram (CELEXA) 40 MG tablet TAKE 1 TABLET (40 MG TOTAL) BY MOUTH DAILY.    Salley Scarlet, MD  gabapentin (NEURONTIN) 300 MG capsule Take 300 mg by mouth 3 (three) times daily.    Historical Provider, MD  hydrocortisone (ANUSOL-HC) 2.5 % rectal cream Place 1 application rectally 2 (two) times daily. Apply rectally twice a day for 5 days 11/14/13   Salley Scarlet, MD  lubiprostone (AMITIZA) 24 MCG capsule Take 1 capsule (24 mcg total) by mouth 2 (two) times daily with a meal. 12/27/13   Salley Scarlet, MD  oxycodone (OXY-IR) 5 MG capsule Take 5 mg by mouth every 4 (four) hours as needed.    Historical Provider, MD  PRESCRIPTION MEDICATION Inject 1 mL as directed every 3 (three) months. Injection given at MD's office to trick body into thinking it is going through menopause.    Historical Provider, MD   BP 132/90  Pulse 85  Temp(Src) 98.6 F (37 C) (Oral)  Resp 14  Ht  (1.676 m)  Wt 216 lb (97.977 kg)  BMI 34.88 kg/m2  SpO2 99%  LMP 04/10/2014 Physical Exam  Nursing note and vitals reviewed. Constitutional: She is oriented to person, place, and time. She appears well-developed and well-nourished. No distress.  HENT:  Head: Normocephalic and atraumatic.  Eyes: Conjunctivae and EOM are normal.  Cardiovascular: Normal rate and regular rhythm.   Pulmonary/Chest: Effort normal and breath sounds normal. No stridor. No respiratory distress.  Abdominal: She exhibits no distension.  Musculoskeletal: She exhibits no edema.  Neurological: She is alert and oriented to person, place, and time. She displays normal reflexes. No cranial nerve deficit. She exhibits  normal muscle tone. Coordination normal.  Skin: Skin is warm and dry.  Psychiatric: She has a normal mood and affect.    ED Course  Procedures (including critical care time)  11:22 PM Patient smiling MDM   Patient presents with concern of ongoing headache.  She is neurologically intact and hemodynamically stable, and has resolution of symptoms her. She was discharged with medications, refer that to assist with followup.    Gerhard Munch, MD 04/13/14 2322
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
## 2014-04-28 ENCOUNTER — Ambulatory Visit: Payer: 59 | Admitting: Family Medicine

## 2014-05-04 ENCOUNTER — Ambulatory Visit: Payer: 59

## 2015-01-17 ENCOUNTER — Encounter (HOSPITAL_COMMUNITY): Payer: Self-pay | Admitting: Family Medicine

## 2015-01-17 ENCOUNTER — Emergency Department (HOSPITAL_COMMUNITY)
Admission: EM | Admit: 2015-01-17 | Discharge: 2015-01-17 | Disposition: A | Discharge status: Home / Self Care | Payer: No Typology Code available for payment source | Attending: Emergency Medicine | Admitting: Emergency Medicine

## 2015-01-17 DIAGNOSIS — Z9109 Other allergy status, other than to drugs and biological substances: Secondary | ICD-10-CM

## 2015-01-17 DIAGNOSIS — L237 Allergic contact dermatitis due to plants, except food: Secondary | ICD-10-CM | POA: Insufficient documentation

## 2015-01-17 DIAGNOSIS — F419 Anxiety disorder, unspecified: Secondary | ICD-10-CM | POA: Insufficient documentation

## 2015-01-17 MED ORDER — PREDNISONE 10 MG PO TABS: ORAL_TABLET | ORAL | Status: DC

## 2015-01-17 NOTE — ED Notes (Signed)
Pt here with rash to arms, neck, face and right ear. sts her ear feels swollen and tight.

## 2015-01-17 NOTE — Discharge Instructions (Signed)

## 2015-01-17 NOTE — ED Provider Notes (Signed)
CSN: [id number]     Arrival date & time 01/17/15  7414 History  This chart was scribed for non-physician practitioner, Cheron Schaumann, PA-C working with Pricilla Loveless, MD by Placido Sou, ED scribe. This patient was seen in room TR08C/TR08C and the patient's care was started at 9:24 AM.     Chief Complaint  Patient presents with  . Rash   The history is provided by the patient. No language interpreter was used.    HPI Comments: Monica Stein is a 39 y.o. female who presents to the Emergency Department complaining of a worsening, mild, rash, affecting her face, ears, eyes and extremities with onset a few days ago. She notes trimming a bush on her front porch with symptoms following shortly thereafter. She further notes using OTC topical treatments with no alleviation of symptoms. Pt denies any known drug allergies. She denies any other associated symptoms.   Past Medical History  Diagnosis Date  . Anxiety    Past Surgical History  Procedure Laterality Date  . Knee surgery    . Cholecystectomy    . Tubal ligation    . Kidney donation     History reviewed. No pertinent family history. History  Substance Use Topics  . Smoking status: Never Smoker   . Smokeless tobacco: Never Used  . Alcohol Use: Yes     Comment: occasionally   OB History    No data available     Review of Systems  Constitutional: Negative for fever and chills.  Skin: Positive for rash. Negative for color change.  All other systems reviewed and are negative.     Allergies  Hydrocodone-acetaminophen and Oxycodone-acetaminophen  Home Medications   Prior to Admission medications   Medication Sig Start Date End Date Taking? Authorizing Provider  acetaminophen (TYLENOL) 500 MG tablet Take 1,000 mg by mouth every 6 (six) hours as needed for mild pain.     Historical Provider, MD  diazepam (VALIUM) 5 MG tablet Take 1 tablet (5 mg total) by mouth every 12 (twelve) hours as needed for muscle spasms. 04/13/14    Gerhard Munch, MD   BP 135/101 mmHg  Pulse 92  Temp(Src) 97.7 F (36.5 C)  Resp 18  SpO2 98% Physical Exam  Constitutional: She is oriented to person, place, and time. She appears well-developed and well-nourished. No distress.  HENT:  Head: Normocephalic and atraumatic.  Mouth/Throat: Oropharynx is clear and moist.  Eyes: Conjunctivae and EOM are normal. Pupils are equal, round, and reactive to light.  Neck: Normal range of motion. Neck supple. No tracheal deviation present.  Cardiovascular: Normal rate.   Pulmonary/Chest: Breath sounds normal. No respiratory distress.  Abdominal: Soft.  Musculoskeletal: Normal range of motion.  Neurological: She is alert and oriented to person, place, and time.  Skin: Skin is warm and dry. Rash noted. There is erythema.  Linear erythematous raised rash to face, neck and arms  Psychiatric: She has a normal mood and affect. Her behavior is normal.  Nursing note and vitals reviewed.   ED Course  Procedures  DIAGNOSTIC STUDIES: Oxygen Saturation is 98% on RA, normal by my interpretation.    COORDINATION OF CARE: 9:26 AM Discussed treatment plan with pt at bedside including a prescription for prednisone and additional OTC recommendations as needed. Pt agreed to plan.  Labs Review Labs Reviewed - No data to display  Imaging Review No results found.   EKG Interpretation None      MDM   Final diagnoses:  Allergy to  poison ivy    Prednisone taper benadryl   I personally performed the services in this documentation, which was scribed in my presence.  The recorded information has been reviewed and considered.   Barnet Pall. Elson Areas, PA-C 01/17/15 9147  Elson Areas, PA-C 01/17/15 8295  Pricilla Loveless, MD 01/18/15 [phone]

## 2015-01-31 ENCOUNTER — Ambulatory Visit: Payer: No Typology Code available for payment source | Attending: Family Medicine

## 2015-08-07 ENCOUNTER — Telehealth: Payer: Self-pay | Admitting: *Deleted

## 2015-08-07 ENCOUNTER — Encounter: Payer: Self-pay | Admitting: Family Medicine

## 2015-08-07 ENCOUNTER — Ambulatory Visit (INDEPENDENT_AMBULATORY_CARE_PROVIDER_SITE_OTHER): Payer: BLUE CROSS/BLUE SHIELD | Admitting: Family Medicine

## 2015-08-07 VITALS — BP 124/64 | HR 72 | Temp 98.2°F | Resp 18 | Ht 66.0 in | Wt 221.0 lb

## 2015-08-07 DIAGNOSIS — K649 Unspecified hemorrhoids: Secondary | ICD-10-CM | POA: Diagnosis not present

## 2015-08-07 DIAGNOSIS — Z23 Encounter for immunization: Secondary | ICD-10-CM | POA: Diagnosis not present

## 2015-08-07 DIAGNOSIS — F411 Generalized anxiety disorder: Secondary | ICD-10-CM

## 2015-08-07 DIAGNOSIS — G5603 Carpal tunnel syndrome, bilateral upper limbs: Secondary | ICD-10-CM | POA: Diagnosis not present

## 2015-08-07 DIAGNOSIS — K589 Irritable bowel syndrome without diarrhea: Secondary | ICD-10-CM

## 2015-08-07 DIAGNOSIS — M170 Bilateral primary osteoarthritis of knee: Secondary | ICD-10-CM | POA: Diagnosis not present

## 2015-08-07 DIAGNOSIS — G5600 Carpal tunnel syndrome, unspecified upper limb: Secondary | ICD-10-CM | POA: Insufficient documentation

## 2015-08-07 LAB — COMPREHENSIVE METABOLIC PANEL
ALBUMIN: 3.9 g/dL (ref 3.6–5.1)
ALK PHOS: 59 U/L (ref 33–115)
ALT: 12 U/L (ref 6–29)
AST: 15 U/L (ref 10–30)
BUN: 10 mg/dL (ref 7–25)
CALCIUM: 8.7 mg/dL (ref 8.6–10.2)
CO2: 24 mmol/L (ref 20–31)
CREATININE: 0.9 mg/dL (ref 0.50–1.10)
Chloride: 104 mmol/L (ref 98–110)
Glucose, Bld: 88 mg/dL (ref 70–99)
Potassium: 4.5 mmol/L (ref 3.5–5.3)
SODIUM: 136 mmol/L (ref 135–146)
Total Bilirubin: 0.3 mg/dL (ref 0.2–1.2)
Total Protein: 6.1 g/dL (ref 6.1–8.1)

## 2015-08-07 LAB — CBC WITH DIFFERENTIAL/PLATELET
BASOS PCT: 0 % (ref 0–1)
Basophils Absolute: 0 10*3/uL (ref 0.0–0.1)
Eosinophils Absolute: 0.1 10*3/uL (ref 0.0–0.7)
Eosinophils Relative: 2 % (ref 0–5)
HCT: 40.3 % (ref 36.0–46.0)
Hemoglobin: 14.2 g/dL (ref 12.0–15.0)
Lymphocytes Relative: 28 % (ref 12–46)
Lymphs Abs: 1.7 10*3/uL (ref 0.7–4.0)
MCH: 30.2 pg (ref 26.0–34.0)
MCHC: 35.2 g/dL (ref 30.0–36.0)
MCV: 85.7 fL (ref 78.0–100.0)
MPV: 9.3 fL (ref 8.6–12.4)
Monocytes Absolute: 0.5 10*3/uL (ref 0.1–1.0)
Monocytes Relative: 8 % (ref 3–12)
NEUTROS ABS: 3.8 10*3/uL (ref 1.7–7.7)
Neutrophils Relative %: 62 % (ref 43–77)
Platelets: 209 10*3/uL (ref 150–400)
RBC: 4.7 MIL/uL (ref 3.87–5.11)
RDW: 14.1 % (ref 11.5–15.5)
WBC: 6.2 10*3/uL (ref 4.0–10.5)

## 2015-08-07 LAB — TSH: TSH: 1.693 u[IU]/mL (ref 0.350–4.500)

## 2015-08-07 MED ORDER — HYDROCODONE-ACETAMINOPHEN 5-325 MG PO TABS: 1.0000 | ORAL_TABLET | Freq: Four times a day (QID) | ORAL | Status: DC | PRN

## 2015-08-07 MED ORDER — FAMCICLOVIR 250 MG PO TABS: 250.0000 mg | ORAL_TABLET | Freq: Two times a day (BID) | ORAL | Status: DC

## 2015-08-07 MED ORDER — ALPRAZOLAM 0.5 MG PO TABS: 0.5000 mg | ORAL_TABLET | Freq: Two times a day (BID) | ORAL | Status: DC | PRN

## 2015-08-07 MED ORDER — PREGABALIN 75 MG PO CAPS: 75.0000 mg | ORAL_CAPSULE | Freq: Two times a day (BID) | ORAL | Status: DC

## 2015-08-07 MED ORDER — CITALOPRAM HYDROBROMIDE 20 MG PO TABS: 20.0000 mg | ORAL_TABLET | Freq: Every day | ORAL | Status: DC

## 2015-08-07 NOTE — Progress Notes (Signed)
Patient ID: Monica Stein, female   DOB: 09/27/1975, 40 y.o.   MRN: [id number]   Subjective:    Patient ID: Monica Stein, female    DOB: 09/06/1975, 40 y.o.   MRN: [id number]  Patient presents for Medication Review/ Refill   Pt here to F/U medications. In past treated for anxiety with celexa and xanax. When she lost her insurance she was not able to come in for her visit or see any of the specialists we refer her to. She has been off her medications for the past 8-9 months. These have been very stressful at home she needs to restart her medications.  Chronic pain she has chronic bilateral knee pain she was being followed by orthopedics she's had arthroscopic done as well as multiple injections but still has significant pain. Because of her age they do not want to intervene any further. She was given hydrocodone after her surgery had very mild itching but not have any other reaction thinks that she can tolerate this. Currently she is taking Tylenol. 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  Continues to have difficulty with her bowels. She has history of chronic constipation however for about the past year she's had severe diarrheal episodes after she eats. She also gets cramping associated. Her hemorrhoids are worse because she is wiping and sit of previous they were bleeding because of straining. She was unable to get a gastroenterology after she lost her insurance but now is able to go.  History of bilateral tingling and numbness in her hands. She was seen by orthopedics MRI was done on her neck which showed bulging disc but no nerve impingement but she continues have the tingling and numbness concerning for carpal tunnel. She was not able to follow for the nerve conduction study. They did give her Lyrica which helped but once again she did not continue because her interns problems.    Review Of Systems:  GEN- denies fatigue,  fever, weight loss,weakness, recent illness HEENT- denies eye drainage, change in vision, nasal discharge, CVS- denies chest pain, palpitations RESP- denies SOB, cough, wheeze ABD- denies N/V, +change in stools, abd pain GU- denies dysuria, hematuria, dribbling, incontinence MSK- + joint pain, muscle aches, injury Neuro- denies headache, dizziness, syncope, seizure activity       Objective:    BP 124/64 mmHg  Pulse 72  Temp(Src) 98.2 F (36.8 C) (Oral)  Resp 18  Ht 5\' 6"  (1.676 m)  Wt 221 lb (100.245 kg)  BMI 35.69 kg/m2  LMP 08/04/2015 (Approximate) GEN- NAD, alert and oriented x3 HEENT- PERRL, EOMI, non injected sclera, pink conjunctiva, MMM, oropharynx clear Neck- Supple, no thyromegaly CVS- RRR, no murmur RESP-CTAB ABD-NABS,soft,NT,ND Psych- normal affect and mood  EXT- No edema Pulses- Radial  2+        Assessment & Plan:      Problem List Items Addressed This Visit    OA (osteoarthritis) of knee    Hydrocodone given she has been able to take this is very minimal itching I do not think that it is a significant allergy. She is unable to follow-up with orthopedics at this time.      Relevant Medications   HYDROcodone-acetaminophen (NORCO) 5-325 MG tablet   Hemorrhoids   Relevant Orders   Ambulatory referral to Gastroenterology   GAD (generalized anxiety disorder) - Primary    Restart Celexa 20 mg wall also restart her alprazolam 0.5 mg  as needed.      Carpal tunnel syndrome    Concern for carpal tunnel like syndrome. She's had imaging done of her neck which showed some disc bulge but no nerve impingement to suggest her symptoms. She is now ready to proceed with the nerve conduction. She did get some improvement with Lyrica therefore Im going to try her back on this at 75 mg twice a day, once other meds in system       Relevant Medications   citalopram (CELEXA) 20 MG tablet   ALPRAZolam (XANAX) 0.5 MG tablet   pregabalin (LYRICA) 75 MG capsule   Other  Relevant Orders   Nerve conduction test    Other Visit Diagnoses    IBS (irritable bowel syndrome)        She has severe hemorrhoids with history of severe constipation now with diarrhea for greater then 1 year after eating. I think this warrants evaluation by GI    Relevant Orders    CBC with Differential/Platelet    Comprehensive metabolic panel    TSH    Ambulatory referral to Gastroenterology    Need for prophylactic vaccination and inoculation against influenza        Relevant Orders    Flu Vaccine QUAD 36+ mos PF IM (Fluarix & Fluzone Quad PF) (Completed)       Note: This dictation was prepared with Dragon dictation along with smaller phrase technology. Any transcriptional errors that result from this process are unintentional.

## 2015-08-07 NOTE — Telephone Encounter (Signed)
Call placed to patient. No answer. No VM.  

## 2015-08-07 NOTE — Assessment & Plan Note (Signed)
Restart Celexa 20 mg wall also restart her alprazolam 0.5 mg as needed.

## 2015-08-07 NOTE — Assessment & Plan Note (Signed)
Hydrocodone given she has been able to take this is very minimal itching I do not think that it is a significant allergy. She is unable to follow-up with orthopedics at this time.

## 2015-08-07 NOTE — Telephone Encounter (Signed)
-----   Message from Salley ScarletKawanta F Hiko, MD sent at 08/07/2015  1:47 PM EST ----- Regarding: Medication clarification    Call, pt I need to clarify meds,  Start the Celexa now, along with xanax as needed  Do not start thee lyrica for a 3-4 weeks to make sure no problems getting back on Celexa  Pain meds can be used as needed

## 2015-08-07 NOTE — Assessment & Plan Note (Addendum)
Concern for carpal tunnel like syndrome. She's had imaging done of her neck which showed some disc bulge but no nerve impingement to suggest her symptoms. She is now ready to proceed with the nerve conduction. She did get some improvement with Lyrica therefore Im going to try her back on this at 75 mg twice a day, once other meds in system

## 2015-08-07 NOTE — Patient Instructions (Signed)
Take lyrica twice a day for a week- see if this helps Nerve conduction for hands Referral to GI for your stomach Pain medication for your knee Restart Celexa 20mg  and Xanax as needed We will call with lab results F/U 6 weeks

## 2015-08-08 NOTE — Telephone Encounter (Signed)
Call placed to patient. No answer. No VM.  

## 2015-08-10 NOTE — Telephone Encounter (Signed)
Multiple calls placed to patient with no answer and no return call.   Message to be closed.  

## 2015-08-16 ENCOUNTER — Encounter: Payer: Self-pay | Admitting: Gastroenterology

## 2015-09-12 ENCOUNTER — Other Ambulatory Visit: Payer: Self-pay | Admitting: Family Medicine

## 2015-09-13 NOTE — Telephone Encounter (Signed)
Ok to refill??  Last office visit/ refill 08/07/2015.

## 2015-09-14 NOTE — Telephone Encounter (Signed)
Okay to send in script for 30 days supply, R 2

## 2015-09-14 NOTE — Telephone Encounter (Signed)
Medication called to pharmacy. 

## 2015-09-18 ENCOUNTER — Ambulatory Visit (INDEPENDENT_AMBULATORY_CARE_PROVIDER_SITE_OTHER): Payer: BLUE CROSS/BLUE SHIELD | Admitting: Family Medicine

## 2015-09-18 ENCOUNTER — Encounter: Payer: Self-pay | Admitting: Family Medicine

## 2015-09-18 VITALS — BP 126/64 | HR 72 | Temp 98.5°F | Resp 16 | Ht 66.0 in | Wt 216.0 lb

## 2015-09-18 DIAGNOSIS — F411 Generalized anxiety disorder: Secondary | ICD-10-CM | POA: Diagnosis not present

## 2015-09-18 DIAGNOSIS — J45990 Exercise induced bronchospasm: Secondary | ICD-10-CM

## 2015-09-18 DIAGNOSIS — Z13220 Encounter for screening for lipoid disorders: Secondary | ICD-10-CM

## 2015-09-18 DIAGNOSIS — E669 Obesity, unspecified: Secondary | ICD-10-CM | POA: Diagnosis not present

## 2015-09-18 LAB — LIPID PANEL
Cholesterol: 187 mg/dL (ref 125–200)
HDL: 42 mg/dL — ABNORMAL LOW (ref >=46)
LDL CALC: 99 mg/dL (ref <130)
Total CHOL/HDL Ratio: 4.5 Ratio (ref <=5.0)
Triglycerides: 229 mg/dL — ABNORMAL HIGH (ref <150)
VLDL: 46 mg/dL — AB (ref <30)

## 2015-09-18 MED ORDER — CITALOPRAM HYDROBROMIDE 40 MG PO TABS: 40.0000 mg | ORAL_TABLET | Freq: Every day | ORAL | Status: DC

## 2015-09-18 MED ORDER — ALBUTEROL SULFATE 108 (90 BASE) MCG/ACT IN AEPB: 2.0000 | INHALATION_SPRAY | RESPIRATORY_TRACT | Status: DC | PRN

## 2015-09-18 NOTE — Patient Instructions (Signed)
Proair as needed We will call with cholesterol labs Celexa increased to  F/U 4 months -PHYSICAL

## 2015-09-18 NOTE — Progress Notes (Signed)
Patient ID: Monica Stein, female   DOB: [birth year], 40 y.o.   MRN: [id number]    Subjective:    Patient ID: Monica Stein, female    DOB: October 02, 1975, 40 y.o.   MRN: [id number]  Patient presents for 6 week F/U  patient here to follow-up. She was seen 6 weeks ago and she was restarted on Celexa 20 mg due to increased stress and anxiety. She states the medication is separately help but she still has some days where she feels overwhelmed. She has decreased the use of the Xanax is only required a 2 or 3 times since being back on the Celexa. She is also only use her hydrocodone a few times.  She requested something for her asthma she has history of exercise-induced asthma which typically gave her problems when she is much younger but she notices the past few weeks that she is felt a little short of breath in had some chest tightness on and off. She also had wheezing when this occurred. It was mostly when she was exerting herself. She denies any fever or denies any production with any cough or head cold.    Review Of Systems:  GEN- + fatigue, fever, weight loss,weakness, recent illness HEENT- denies eye drainage, change in vision, nasal discharge, CVS- denies chest pain, palpitations RESP- denies SOB, +cough, +wheeze ABD- denies N/V, change in stools, abd pain GU- denies dysuria, hematuria, dribbling, incontinence MSK- denies joint pain, muscle aches, injury Neuro- denies headache, dizziness, syncope, seizure activity       Objective:    BP 126/64 mmHg  Pulse 72  Temp(Src) 98.5 F (36.9 C) (Oral)  Resp 16  Ht  (1.676 m)  Wt 216 lb (97.977 kg)  BMI 34.88 kg/m2  LMP 09/04/2015 (Approximate) GEN- NAD, alert and oriented x3 HEENT- PERRL, EOMI, non injected sclera, pink conjunctiva, MMM, oropharynx clear Neck- Supple, no LAD CVS- RRR, no murmur RESP-CTAB Psych- normal affect and mood           Assessment & Plan:      Problem List Items Addressed This Visit    GAD  (generalized anxiety disorder)    Increase Celexa to  , continue xanax as needed, she is decreasing the use      EXERCISE INDUCED ASTHMA    Given albuterol to use as needed we'll see how often she truly uses this before starting any preventative measures       Other Visit Diagnoses    Screening cholesterol level    -  Primary    Relevant Orders    Lipid panel    Obesity        Relevant Orders    Lipid panel       Note: This dictation was prepared with Dragon dictation along with smaller phrase technology. Any transcriptional errors that result from this process are unintentional.

## 2015-09-18 NOTE — Progress Notes (Signed)
Patient ID: Monica Stein, female   DOB: 01/26/1976, 40 y.o.   MRN: [id number]  Patient Peak Flow: [phone]

## 2015-09-18 NOTE — Assessment & Plan Note (Signed)
Given albuterol to use as needed we'll see how often she truly uses this before starting any preventative measures

## 2015-09-18 NOTE — Assessment & Plan Note (Addendum)
Increase Celexa to  , continue xanax as needed, she is decreasing the use

## 2015-09-20 ENCOUNTER — Ambulatory Visit (INDEPENDENT_AMBULATORY_CARE_PROVIDER_SITE_OTHER): Payer: Self-pay | Admitting: Neurology

## 2015-09-20 ENCOUNTER — Ambulatory Visit (INDEPENDENT_AMBULATORY_CARE_PROVIDER_SITE_OTHER): Payer: BLUE CROSS/BLUE SHIELD | Admitting: Neurology

## 2015-09-20 ENCOUNTER — Encounter: Payer: Self-pay | Admitting: Neurology

## 2015-09-20 DIAGNOSIS — R202 Paresthesia of skin: Secondary | ICD-10-CM

## 2015-09-20 DIAGNOSIS — G5603 Carpal tunnel syndrome, bilateral upper limbs: Secondary | ICD-10-CM

## 2015-09-20 DIAGNOSIS — M25529 Pain in unspecified elbow: Secondary | ICD-10-CM

## 2015-09-20 NOTE — Progress Notes (Signed)
Please refer to EMG and nerve conduction study procedure note. 
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
## 2015-09-24 ENCOUNTER — Other Ambulatory Visit: Payer: Self-pay | Admitting: *Deleted

## 2015-09-24 MED ORDER — FISH OIL 1000 MG PO CPDR: 1.0000 | DELAYED_RELEASE_CAPSULE | Freq: Two times a day (BID) | ORAL | Status: DC

## 2015-09-25 ENCOUNTER — Ambulatory Visit (INDEPENDENT_AMBULATORY_CARE_PROVIDER_SITE_OTHER): Payer: BLUE CROSS/BLUE SHIELD | Admitting: Physician Assistant

## 2015-09-25 ENCOUNTER — Encounter: Payer: Self-pay | Admitting: Physician Assistant

## 2015-09-25 VITALS — BP 116/70 | HR 78 | Ht 66.0 in | Wt 217.0 lb

## 2015-09-25 DIAGNOSIS — K5900 Constipation, unspecified: Secondary | ICD-10-CM | POA: Diagnosis not present

## 2015-09-25 DIAGNOSIS — K625 Hemorrhage of anus and rectum: Secondary | ICD-10-CM | POA: Diagnosis not present

## 2015-09-25 DIAGNOSIS — K648 Other hemorrhoids: Secondary | ICD-10-CM | POA: Diagnosis not present

## 2015-09-25 DIAGNOSIS — K219 Gastro-esophageal reflux disease without esophagitis: Secondary | ICD-10-CM | POA: Diagnosis not present

## 2015-09-25 MED ORDER — HYDROCORTISONE ACETATE 25 MG RE SUPP: 25.0000 mg | Freq: Two times a day (BID) | RECTAL | Status: DC

## 2015-09-25 MED ORDER — LUBIPROSTONE 24 MCG PO CAPS: 24.0000 ug | ORAL_CAPSULE | Freq: Two times a day (BID) | ORAL | Status: DC

## 2015-09-25 MED ORDER — PANTOPRAZOLE SODIUM 40 MG PO TBEC: 40.0000 mg | DELAYED_RELEASE_TABLET | Freq: Every day | ORAL | Status: DC

## 2015-09-25 MED ORDER — LUBIPROSTONE 8 MCG PO CAPS: 8.0000 ug | ORAL_CAPSULE | Freq: Two times a day (BID) | ORAL | Status: DC

## 2015-09-25 MED ORDER — NA SULFATE-K SULFATE-MG SULF 17.5-3.13-1.6 GM/177ML PO SOLN
1.0000 | ORAL | Status: DC
Start: 2015-09-25 — End: 2015-10-19

## 2015-09-25 NOTE — Progress Notes (Addendum)
Patient ID: Monica Stein, female   DOB: November 23, 1975, 40 y.o.   MRN: [id number]    HPI:  Monica Stein is a 40 y.o.   female referred by Alycia Rossetti, MD  For evaluation of GERD and constipation. Monica Stein has a history of anxiety, depression, asthma , osteoarthritis , carpal tunnel syndrome , and IBS. She states that she has been constipated since childhood and will often skip 4 or 5 days between bowel movements and have either a very hard stool or explosive diarrhea. She has been unable to identify any foods that exacerbate or alleviate her symptoms. She frequently gets crampy lower abdominal pain prior to defecation, that is relieved with defecation. Over the past 6 to 8 months she has been having frequent blood with her stools with blood on the toilet tissue, blood on the stool, and blood dripping in the commode. She states she has had hemorrhoids since middle school but has never used anything for them. She has frequent rectal pain, itching, and burning and often has to push hemorrhoids back into her rectum. Monica Stein has no pain passing her stools , but states she will often have rectal throbbing after a large bowel movement. She was evaluated at [facility] 2 or 3 years ago and had a flexible sigmoidoscopy. She states she was never told of the findings and she never followed up.  Monica Stein reports that she has a maternal with Crohn's disease but she is unaware of the family history of colon cancer or colon polyps.   She reports she has also been having heartburn on a daily basis for the past several months. She has tried toms with varying degrees of relief. She frequently wakes up with burning in her throat and feels as if she has a lump in her throat. She denies dysphagia but admits to nocturnal regurgitation. She denies early satiety. She denies use of  Nonsteroidal anti-inflammatory drugs and denies use of alcohol.    Past Medical History  Diagnosis Date  . Anxiety   . 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 Depression   . Headaches, cluster     Past Surgical History  Procedure Laterality Date  . Knee surgery    . Cholecystectomy    . Tubal ligation    . Kidney donation    . Appendectomy     Family History  Problem Relation Age of Onset  . Breast cancer Maternal Aunt   . Crohn's disease Maternal Aunt   . Diabetes Maternal Grandfather   . Heart disease Maternal Grandmother   . Heart disease Maternal Grandfather    Social History  Substance Use Topics  . Smoking status: Never Smoker   . Smokeless tobacco: Never Used  . Alcohol Use: No     Comment: occasionally   Current Outpatient Prescriptions  Medication Sig Dispense Refill  . acetaminophen (TYLENOL) 500 MG tablet Take 1,000 mg by mouth every 6 (six) hours as needed for mild pain.     . Albuterol Sulfate (PROAIR RESPICLICK) 979 (90 Base) MCG/ACT AEPB Inhale 2 puffs into the lungs every 4 (four) hours as needed. 1 each 2  . ALPRAZolam (XANAX) 0.5 MG tablet Take 1 tablet (0.5 mg total) by mouth 2 (two) times daily as needed for anxiety. 45 tablet 1  . citalopram (CELEXA) 40 MG tablet Take 1 tablet (40 mg total) by mouth daily. 30 tablet 3  . famciclovir (FAMVIR) 250 MG tablet Take 1 tablet (  250 mg total) by mouth 2 (two) times daily. 60 tablet 3  . HYDROcodone-acetaminophen (NORCO) 5-325 MG tablet Take 1 tablet by mouth every 6 (six) hours as needed for moderate pain. 30 tablet 0  . LYRICA 75 MG capsule TAKE ONE CAPSULE BY MOUTH TWICE A DAY 60 capsule 2  . Multiple Vitamin (MULTIVITAMIN WITH MINERALS) TABS tablet Take 1 tablet by mouth daily.    . Omega-3 Fatty Acids (FISH OIL) 1000 MG CPDR Take 1 capsule by mouth 2 (two) times daily.    . hydrocortisone (ANUSOL-HC) 25 MG suppository Place 1 suppository (25 mg total) rectally 2 (two) times daily. 10 suppository 1  . lubiprostone (AMITIZA) 8 MCG capsule Take 1 capsule (8 mcg total) by mouth 2 (two) times daily with a meal. 60 capsule 3  . Na Sulfate-K Sulfate-Mg Sulf SOLN Take 1  kit by mouth as directed. 354 mL 0  . pantoprazole (PROTONIX) 40 MG tablet Take 1 tablet (40 mg total) by mouth daily. 30 mins before breakfast. 30 tablet 3   No current facility-administered medications for this visit.   Allergies  Allergen Reactions  . Hydrocodone-Acetaminophen Itching  . Oxycodone-Acetaminophen Itching     Review of Systems: Gen: Denies any fever, chills, sweats, anorexia, fatigue, weakness, malaise, weight loss, and sleep disorder CV: Denies chest pain, angina, palpitations, syncope, orthopnea, PND, peripheral edema, and claudication. Resp: Denies dyspnea at rest, dyspnea with exercise, cough, sputum, wheezing, coughing up blood, and pleurisy. GI: Denies vomiting blood, jaundice, and fecal incontinence.   Denies dysphagia or odynophagia. GU : Denies urinary burning, blood in urine, urinary frequency, urinary hesitancy, nocturnal urination, and urinary incontinence. MS: Denies joint pain, limitation of movement, and swelling, stiffness, low back pain, extremity pain. Denies muscle weakness, cramps, atrophy.  Derm: Denies rash, itching, dry skin, hives, moles, warts, or unhealing ulcers.  Psych: Denies depression, anxiety, memory loss, suicidal ideation, hallucinations, paranoia, and confusion. Heme: Denies bruising, bleeding, and enlarged lymph nodes. Neuro:  Denies any headaches, dizziness, paresthesias. Endo:  Denies any problems with DM, thyroid, adrenal function   LAB RESULTS:  Blood work 08/07/2015 showed WBC 6.2, hemoglobin 14.2, hematocrit 40.3, MCV 85.7, platelets 209,000.  comprehensive metabolic panel 98/26/4158 total bili 0.3, alkaline phosphatase 59, AST 15, ALT 12.  TSH 1.693.    Physical Exam: BP 116/70 mmHg  Pulse 78  Ht _0  (1.676 m)  Wt 217 lb (98.431 kg)  BMI 35.04 kg/m2  LMP 09/04/2015 (Approximate) Constitutional: Pleasant,well-developed,  Caucasianfemale in no acute distress. Piercing upper lip. Multiple tattoos. HEENT: Normocephalic  and atraumatic. Conjunctivae are normal. No scleral icterus. Neck supple.  No JVD Cardiovascular: Normal rate, regular rhythm.  Pulmonary/chest: Effort normal and breath sounds normal. No wheezing, rales or rhonchi. Abdominal: Soft, nondistended, nontender. Bowel sounds active throughout. There are no masses palpable. No hepatomegaly. Rectal: external hemorrhoids. Brown stool, Hemoccult positive. Anoscopy with internal hemorrhoids. Extremities: no edema Lymphadenopathy: No cervical adenopathy noted. Neurological: Alert and oriented to person place and time. Skin: Skin is warm and dry. No rashes noted. Psychiatric: Normal mood and affect. Behavior is normal.  ASSESSMENT AND PLAN:   #1. GERD. An antireflux regimen has been reviewed. She will be given a trial of pantoprazole 40 mg 1 by mouth every morning 30 minutes prior to breakfast.    #2. Constipation. This is been an ongoing problem since her childhood. She readily admits that she has not been regular with any regimen she has tried. She has used fiber and  Mira lax  with no relief. She's been instructed to try to increase fiber and water in her diet. She will be given a trial of Amitiza 8 g by mouth twice daily. She will call in 2 weeks to let us know if it is helping. If not , she will be increased to 24 g.    #3. Rectal bleeding. This is likely due to her hemorrhoids. She will be given a trial of Anusol HC suppositories 1 per rectum twice daily for 10 days. She will also be scheduled for colonoscopy to evaluate for polyps, neoplasia , or other intraluminal pathology.The risks, benefits, and alternatives to colonoscopy with possible biopsy and possible polypectomy were discussed with the patient and they consent to proceed.   She has signed a medical release to try to obtain records from [facility].    further recommendations will be made pending the findings of the above.   Monica Stein, Deloris Ping 09/25/2015, 11:31 AM  CC: Alycia Rossetti,  MD    Addendum 10/04/2015: We received notes from [facility]. Monica Stein was evaluated in the [facility] health digestive health clinic on 10/18/2012. She was noted to have a history of chronic abdominal pain and a history of endometriosis. She had a diagnostic sigmoidoscopy on 11/12/2012. She was noted to have a small internal hemorrhoid in the rectum. No abnormalities in the sigmoid colon were noted. She was advised to follow-up with her primary care physician and to use stool softeners as needed.

## 2015-09-25 NOTE — Progress Notes (Signed)
Thank you for sending this case to me. I have reviewed the entire note, and the outlined plan seems appropriate.  

## 2015-09-25 NOTE — Patient Instructions (Signed)
You have been scheduled for a colonoscopy. Please follow written instructions given to you at your visit today.  Please pick up your prep supplies at the pharmacy within the next 1-3 days. If you use inhalers (even only as needed), please bring them with you on the day of your procedure. Your physician has requested that you go to www.startemmi.com and enter the access code given to you at your visit today. This web site gives a general overview about your procedure. However, you should still follow specific instructions given to you by our office regarding your preparation for the procedure.  We have sent the following medications to your pharmacy for you to pick up at your convenience: Pantoprazole 40 mg every morning 30 mins before breakfast Anusol suppositories 1 rectally twice a day Amitiza 8 mcg 1 twice a day  Increase water and fiber intake daily.

## 2015-10-04 ENCOUNTER — Ambulatory Visit: Payer: No Typology Code available for payment source | Admitting: Gastroenterology

## 2015-10-12 ENCOUNTER — Encounter: Payer: Self-pay | Admitting: Physician Assistant

## 2015-10-15 ENCOUNTER — Encounter: Payer: Self-pay | Admitting: Physician Assistant

## 2015-10-15 ENCOUNTER — Other Ambulatory Visit: Payer: Self-pay | Admitting: *Deleted

## 2015-10-15 MED ORDER — CITALOPRAM HYDROBROMIDE 40 MG PO TABS: 40.0000 mg | ORAL_TABLET | Freq: Every day | ORAL | Status: DC

## 2015-10-15 NOTE — Telephone Encounter (Signed)
Received fax requesting refill on Celexa.   Refill appropriate and filled per protocol. 

## 2015-10-19 ENCOUNTER — Ambulatory Visit (AMBULATORY_SURGERY_CENTER): Payer: BLUE CROSS/BLUE SHIELD | Admitting: Gastroenterology

## 2015-10-19 ENCOUNTER — Encounter: Payer: Self-pay | Admitting: Gastroenterology

## 2015-10-19 ENCOUNTER — Other Ambulatory Visit: Payer: Self-pay

## 2015-10-19 VITALS — BP 113/72 | HR 65 | Temp 98.7°F | Resp 14 | Ht 66.0 in | Wt 217.0 lb

## 2015-10-19 DIAGNOSIS — K5900 Constipation, unspecified: Secondary | ICD-10-CM | POA: Diagnosis not present

## 2015-10-19 DIAGNOSIS — K625 Hemorrhage of anus and rectum: Secondary | ICD-10-CM

## 2015-10-19 MED ORDER — LUBIPROSTONE 8 MCG PO CAPS: 8.0000 ug | ORAL_CAPSULE | Freq: Two times a day (BID) | ORAL | Status: DC

## 2015-10-19 MED ORDER — SODIUM CHLORIDE 0.9 % IV SOLN: 500.0000 mL | INTRAVENOUS | Status: DC

## 2015-10-19 NOTE — Op Note (Signed)
[facility] Patient Name: Monica Stein Procedure Date: 10/19/2015 1:22 PM MRN: [id number] Endoscopist: Sherilyn CooterHenry L. Myrtie Neitheranis , MD Age: 40 Referring MD:  Date of Birth: 09/28/1975 Gender: Female Procedure:                Colonoscopy Indications:              Rectal bleeding, Constipation Medicines:                Monitored Anesthesia Care Procedure:                Pre-Anesthesia Assessment:                           - Prior to the procedure, a History and Physical                            was performed, and patient medications and                            allergies were reviewed. The patient's tolerance of                            previous anesthesia was also reviewed. The risks                            and benefits of the procedure and the sedation                            options and risks were discussed with the patient.                            All questions were answered, and informed consent                            was obtained. Prior Anticoagulants: The patient has                            taken no previous anticoagulant or antiplatelet                            agents. ASA Grade Assessment: II - A patient with                            mild systemic disease. After reviewing the risks                            and benefits, the patient was deemed in                            satisfactory condition to undergo the procedure.                           After obtaining informed consent, the colonoscope  was passed under direct vision. Throughout the                            procedure, the patient's blood pressure, pulse, and                            oxygen saturations were monitored continuously. The                            Model CF-HQ190L [id number]) scope was introduced                            through the anus and advanced to the the cecum,                            identified by appendiceal orifice and ileocecal                      valve. The colonoscopy was performed without                            difficulty. The patient tolerated the procedure                            well. The quality of the bowel preparation was                            fair. The ileocecal valve, appendiceal orifice, and                            rectum were photographed. The bowel preparation                            used was 2 day prep, Suprep/Miralax. Scope In: 1:35:34 PM Scope Out: 1:48:46 PM Scope Withdrawal Time: 0 hours 6 minutes 58 seconds  Total Procedure Duration: 0 hours 13 minutes 12 seconds  Findings:      The perianal and digital rectal examinations were normal.      Non-bleeding internal hemorrhoids were found during retroflexion. The       hemorrhoids were medium-sized and Grade I (internal hemorrhoids that do       not prolapse).      The exam was otherwise without abnormality. Complications:            No immediate complications. Estimated Blood Loss:     Estimated blood loss: none. Impression:               - Preparation of the colon was fair.                           - Non-bleeding internal hemorrhoids.                           - The examination was otherwise normal.                           - No specimens  collected. Recommendation:           - Patient has a contact number available for                            emergencies. The signs and symptoms of potential                            delayed complications were discussed with the                            patient. Return to normal activities tomorrow.                            Written discharge instructions were provided to the                            patient.                           - Resume previous diet.                           - Continue present medications.                           - Repeat colonoscopy in 10 years for screening                            purposes.                           - -Amitiza 8 micrograms twice  daily                           -As needed use of preparation H suppository.                           -Consider hemorrhoidal bending if continued                            bleeding despite improvement in constipation. Procedure Code(s):        --- Professional ---                           [phone], Colonoscopy, flexible; diagnostic, including                            collection of specimen(s) by brushing or washing,                            when performed (separate procedure) CPT copyright 2016 American Medical Association. All rights reserved. Henry L. Myrtie Neither, MD Monica Lake Myrtie Neither, MD 10/19/2015 1:56:04 PM Number of Addenda: 0

## 2015-10-19 NOTE — Patient Instructions (Signed)

## 2015-10-19 NOTE — Progress Notes (Addendum)
No egg or soy allergy known to patient  No issues with past sedation with any surgeries  or procedures, no intubation problems  No diet pills per patient No home 02 use per patient  No blood thinners per patient  Past issues with constipation now alternates with diarrhea

## 2015-10-19 NOTE — Progress Notes (Signed)
No problems noted in the recovery room. maw 

## 2015-10-19 NOTE — Progress Notes (Signed)
Report to PACU, RN, vss, BBS= Clear.  

## 2015-10-22 ENCOUNTER — Telehealth: Payer: Self-pay

## 2015-10-22 NOTE — Telephone Encounter (Signed)
Left a message at [phone] for the pt to call us back if any questions or concerns. maw

## 2015-11-01 ENCOUNTER — Telehealth: Payer: Self-pay | Admitting: Gastroenterology

## 2015-11-02 ENCOUNTER — Telehealth: Payer: Self-pay | Admitting: Gastroenterology

## 2015-11-02 NOTE — Telephone Encounter (Signed)
Pt contacted. New savings card provided for pt to pick up.

## 2015-11-05 NOTE — Telephone Encounter (Signed)
Per Pt Monica Stein is too expensive. Even with a coupon. Please advise.

## 2015-11-07 NOTE — Telephone Encounter (Signed)
Please try linzess 145 micrograms once daily.  #30, RF 2

## 2015-11-08 MED ORDER — LINACLOTIDE 145 MCG PO CAPS: 145.0000 ug | ORAL_CAPSULE | Freq: Every day | ORAL | Status: DC

## 2015-11-08 NOTE — Telephone Encounter (Signed)
New RX sent for Linzess 145mcg as directed by Dr Myrtie Neitheranis. Left a voicemail to inform pt.

## 2015-11-22 ENCOUNTER — Encounter: Payer: Self-pay | Admitting: Gastroenterology

## 2015-11-22 ENCOUNTER — Ambulatory Visit (INDEPENDENT_AMBULATORY_CARE_PROVIDER_SITE_OTHER): Payer: BLUE CROSS/BLUE SHIELD | Admitting: Gastroenterology

## 2015-11-22 VITALS — BP 122/82 | HR 86 | Ht 66.0 in | Wt 215.0 lb

## 2015-11-22 DIAGNOSIS — K625 Hemorrhage of anus and rectum: Secondary | ICD-10-CM

## 2015-11-22 DIAGNOSIS — K648 Other hemorrhoids: Secondary | ICD-10-CM | POA: Diagnosis not present

## 2015-11-22 DIAGNOSIS — K589 Irritable bowel syndrome without diarrhea: Secondary | ICD-10-CM | POA: Diagnosis not present

## 2015-11-22 NOTE — Progress Notes (Signed)
[facility] Progress Note  Chief Complaint: rectal bleeding and altered bowel habits  Subjective History:  She is no better on Linzess.  Less constipation, but stool just looser.  Still alternates, which she has done for many years.  Also, the HR are bleeding and a nuisance getting through her day. Still has intermittent upper bloating  ROS: Cardiovascular:  no chest pain Respiratory: no dyspnea  The patient's Past Medical, Family and Social History were reviewed and are on file in the EMR.  Objective:  Med list reviewed  Vital signs in last 24 hrs: Filed Vitals:   11/22/15 1108  BP: 122/82  Pulse: 86    Physical Exam   HEENT: sclera anicteric, oral mucosa moist without lesions  Neck: supple, no thyromegaly, JVD or lymphadenopathy  Cardiac: RRR without murmurs, S1S2 heard, no peripheral edema  Pulm: clear to auscultation bilaterally, normal RR and effort noted  Abdomen: soft, no tenderness, with active bowel sounds. No guarding or palpable hepatosplenomegaly.  Skin; warm and dry, no jaundice or rash  Rectal: the internal HR do not prolapse with valsalva, no DRE done  See recent colonoscopy report   @ASSESSMENTPLANBEGIN @ Assessment: Encounter Diagnoses  Name Primary?  . Rectal bleeding Yes  . Internal bleeding hemorrhoids   . IBS (irritable bowel syndrome)      Unfortunately, I do not have any other meds to offer that I think will help her long-standing IBS with alternating bowel habits. However, we may be able to help with the HR, since they are affecting her life.  Plan: First session HR banding with Dr Leone PayorGessner next month.   Charlie PitterHenry L Danis III

## 2015-11-22 NOTE — Patient Instructions (Signed)
We have set you up for a hemorrhoid banding with Dr Stan Headarl Gessner (12-18-2015)  Thank you for choosing [facility]  Dr Amada JupiterHenry Danis III

## 2015-12-07 ENCOUNTER — Other Ambulatory Visit: Payer: Self-pay | Admitting: Family Medicine

## 2015-12-07 NOTE — Telephone Encounter (Signed)
Refill appropriate and filled per protocol. 

## 2015-12-18 ENCOUNTER — Ambulatory Visit (INDEPENDENT_AMBULATORY_CARE_PROVIDER_SITE_OTHER): Payer: BLUE CROSS/BLUE SHIELD | Admitting: Internal Medicine

## 2015-12-18 ENCOUNTER — Encounter: Payer: Self-pay | Admitting: Internal Medicine

## 2015-12-18 VITALS — BP 110/74 | HR 72 | Ht 66.0 in | Wt 215.6 lb

## 2015-12-18 DIAGNOSIS — K602 Anal fissure, unspecified: Secondary | ICD-10-CM

## 2015-12-18 DIAGNOSIS — K648 Other hemorrhoids: Secondary | ICD-10-CM

## 2015-12-18 DIAGNOSIS — K589 Irritable bowel syndrome without diarrhea: Secondary | ICD-10-CM

## 2015-12-18 HISTORY — DX: Anal fissure, unspecified: K60.2

## 2015-12-18 HISTORY — DX: Other hemorrhoids: K64.8

## 2015-12-18 HISTORY — DX: Irritable bowel syndrome, unspecified: K58.9

## 2015-12-18 MED ORDER — AMBULATORY NON FORMULARY MEDICATION: Status: DC

## 2015-12-18 NOTE — Progress Notes (Signed)
   Sxs: bleeding, itching, anal pain, prolapse, fecal leakage Has mixed IBS bowel pattern  Monica Stein, CMA present.  Rectal: -  Fleshy circumferential tags, mildly tender posterior with induartionsmall rectocele   Premed 0.125% NTG and 5% lidocaine Anoscopy was performed with the patient in the left lateral decubitus position while a chaperone was present and revealed Gr 2 internal hemorrhoids + posterior fissure   PROCEDURE NOTE: The patient presents with symptomatic grade 2 hemorrhoids, requesting rubber band ligation of his/her hemorrhoidal disease.  All risks, benefits and alternative forms of therapy were described and informed consent was obtained.  The decision was made to band the RA  internal hemorrhoid, and the [facility] was used to perform band ligation without complication.  Digital anorectal examination was then performed to assure proper positioning of the band, and to adjust the banded tissue as required.  The patient was discharged home without pain or other issues.  Dietary and behavioral recommendations were given and along with follow-up instructions.     The following adjunctive treatments were recommended:  Benefiber 2-3 tbsp/day Diltiazem cream bid-tid for fissure Try 2 Linzess daily  The patient will return in 2-3 weeks for  follow-up and possible additional banding as required. No complications were encountered and the patient tolerated the procedure well.  ? Needs anorectal manometry to better evaluate constipation  I appreciate the opportunity to care for this patient.  Cc: Amada JupiterHenry Danis, MD Milinda AntisURHAM, KAWANTA, MD

## 2015-12-18 NOTE — Patient Instructions (Addendum)
HEMORRHOID BANDING PROCEDURE    FOLLOW-UP CARE   1. The procedure you have had should have been relatively painless since the banding of the area involved does not have nerve endings and there is no pain sensation.  The rubber band cuts off the blood supply to the hemorrhoid and the band may fall off as soon as 48 hours after the banding (the band may occasionally be seen in the toilet bowl following a bowel movement). You may notice a temporary feeling of fullness in the rectum which should respond adequately to plain Tylenol or Motrin.  2. Following the banding, avoid strenuous exercise that evening and resume full activity the next day.  A sitz bath (soaking in a warm tub) or bidet is soothing, and can be useful for cleansing the area after bowel movements.     3. To avoid constipation, take two tablespoons of natural wheat bran, natural oat bran, flax, Benefiber or any over the counter fiber supplement and increase your water intake to 7-8 glasses daily.    4. Unless you have been prescribed anorectal medication, do not put anything inside your rectum for two weeks: No suppositories, enemas, fingers, etc.  5. Occasionally, you may have more bleeding than usual after the banding procedure.  This is often from the untreated hemorrhoids rather than the treated one.  Don't be concerned if there is a tablespoon or so of blood.  If there is more blood than this, lie flat with your bottom higher than your head and apply an ice pack to the area. If the bleeding does not stop within a half an hour or if you feel faint, call our office at [phone] or go to the emergency room.  6. Problems are not common; however, if there is a substantial amount of bleeding, severe pain, chills, fever or difficulty passing urine (very rare) or other problems, you should call us at [phone] or report to the nearest emergency room.  7. Do not stay seated continuously for more than 2-3 hours for a day or two  after the procedure.  Tighten your buttock muscles 10-15 times every two hours and take 10-15 deep breaths every 1-2 hours.  Do not spend more than a few minutes on the toilet if you cannot empty your bowel; instead re-visit the toilet at a later time.    Use benefiber daily, handout provided.   We have sent the following medications to your pharmacy for you to pick up at your convenience: Diltiazem cream [facility] (faxed rx to them and phoned them as well-they will order the cream and call her when ready)   Today we are giving you a handout to read and follow on Anal fissures.   Try taking 2 Linzess daily and see if that helps more.   I appreciate the opportunity to care for you. Stan Headarl Gessner, MD, [facility]

## 2015-12-19 ENCOUNTER — Ambulatory Visit: Payer: BLUE CROSS/BLUE SHIELD | Admitting: Family Medicine

## 2015-12-20 ENCOUNTER — Encounter: Payer: Self-pay | Admitting: Internal Medicine

## 2015-12-26 ENCOUNTER — Encounter: Payer: Self-pay | Admitting: Family Medicine

## 2016-01-02 ENCOUNTER — Encounter: Payer: Self-pay | Admitting: Internal Medicine

## 2016-01-02 ENCOUNTER — Ambulatory Visit (INDEPENDENT_AMBULATORY_CARE_PROVIDER_SITE_OTHER): Payer: BLUE CROSS/BLUE SHIELD | Admitting: Internal Medicine

## 2016-01-02 VITALS — BP 120/76 | HR 68 | Ht 66.0 in | Wt 219.4 lb

## 2016-01-02 DIAGNOSIS — K602 Anal fissure, unspecified: Secondary | ICD-10-CM | POA: Diagnosis not present

## 2016-01-02 DIAGNOSIS — K648 Other hemorrhoids: Secondary | ICD-10-CM | POA: Diagnosis not present

## 2016-01-02 DIAGNOSIS — K589 Irritable bowel syndrome without diarrhea: Secondary | ICD-10-CM | POA: Diagnosis not present

## 2016-01-02 DIAGNOSIS — K5900 Constipation, unspecified: Secondary | ICD-10-CM | POA: Diagnosis not present

## 2016-01-02 DIAGNOSIS — K5909 Other constipation: Secondary | ICD-10-CM

## 2016-01-02 MED ORDER — LINACLOTIDE 290 MCG PO CAPS: 290.0000 ug | ORAL_CAPSULE | Freq: Every day | ORAL | Status: DC

## 2016-01-02 NOTE — Patient Instructions (Signed)
  HEMORRHOID BANDING PROCEDURE    FOLLOW-UP CARE   1. The procedure you have had should have been relatively painless since the banding of the area involved does not have nerve endings and there is no pain sensation.  The rubber band cuts off the blood supply to the hemorrhoid and the band may fall off as soon as 48 hours after the banding (the band may occasionally be seen in the toilet bowl following a bowel movement). You may notice a temporary feeling of fullness in the rectum which should respond adequately to plain Tylenol or Motrin.  2. Following the banding, avoid strenuous exercise that evening and resume full activity the next day.  A sitz bath (soaking in a warm tub) or bidet is soothing, and can be useful for cleansing the area after bowel movements.     3. To avoid constipation, take two tablespoons of natural wheat bran, natural oat bran, flax, Benefiber or any over the counter fiber supplement and increase your water intake to 7-8 glasses daily.    4. Unless you have been prescribed anorectal medication, do not put anything inside your rectum for two weeks: No suppositories, enemas, fingers, etc.  5. Occasionally, you may have more bleeding than usual after the banding procedure.  This is often from the untreated hemorrhoids rather than the treated one.  Don't be concerned if there is a tablespoon or so of blood.  If there is more blood than this, lie flat with your bottom higher than your head and apply an ice pack to the area. If the bleeding does not stop within a half an hour or if you feel faint, call our office at [phone] or go to the emergency room.  6. Problems are not common; however, if there is a substantial amount of bleeding, severe pain, chills, fever or difficulty passing urine (very rare) or other problems, you should call us at [phone] or report to the nearest emergency room.  7. Do not stay seated continuously for more than 2-3 hours for a day or  two after the procedure.  Tighten your buttock muscles 10-15 times every two hours and take 10-15 deep breaths every 1-2 hours.  Do not spend more than a few minutes on the toilet if you cannot empty your bowel; instead re-visit the toilet at a later time.    We have sent the following medications to your pharmacy for you to pick up at your convenience: Linzess 290mcg   Follow up with Dr Leone PayorGessner as needed.     I appreciate the opportunity to care for you. Stan Headarl Gessner, MD, [facility]

## 2016-01-02 NOTE — Assessment & Plan Note (Signed)
Improving - Linzess effect Can go w/o diltiazem

## 2016-01-02 NOTE — Progress Notes (Signed)
Patient ID: Monica GrillMisty Lynn Stein, female   DOB: 05/17/1976, 40 y.o.   MRN: [id number]   Had hemorrhoidal banding 12/18/2015 Less bleeding and less anal pain (hardly any) 290 ug Linzess working well  Rectal w/ Dr. Reggie PileVasu Rathore present and for subsequent procedures  Medium fleshy anal tags No mass Not tender and no clear fissure  PROCEDURE NOTE: The patient presents with symptomatic grade 2  hemorrhoids, requesting rubber band ligation of his/her hemorrhoidal disease.  All risks, benefits and alternative forms of therapy were described and informed consent was obtained.   The anorectum was pre-medicated with 0.125% NTG and 5% lidocaine The decision was made to band the LL and RP internal hemorrhoids, and the [facility] was used to perform band ligation without complication.  Digital anorectal examination was then performed to assure proper positioning of the band, and to adjust the banded tissue as required.  The patient was discharged home without pain or other issues.  Dietary and behavioral recommendations were given and along with follow-up instructions.     The following adjunctive treatments were recommended:  Continue Linzess at 290 ug qd  The patient will return as needed for  follow-up and possible additional banding as required. No complications were encountered and the patient tolerated the procedure well.  Cc: Amada JupiterHenry Danis, MD Milinda AntisURHAM, KAWANTA, MD

## 2016-01-02 NOTE — Assessment & Plan Note (Signed)
Improved on Linzess stay at 290 ug a day

## 2016-01-02 NOTE — Assessment & Plan Note (Signed)
better 

## 2016-01-02 NOTE — Assessment & Plan Note (Signed)
RP / LL banded

## 2016-01-14 ENCOUNTER — Encounter: Payer: Self-pay | Admitting: Family Medicine

## 2016-01-14 ENCOUNTER — Ambulatory Visit (INDEPENDENT_AMBULATORY_CARE_PROVIDER_SITE_OTHER): Payer: BLUE CROSS/BLUE SHIELD | Admitting: Family Medicine

## 2016-01-14 VITALS — BP 118/64 | HR 72 | Temp 98.3°F | Resp 14 | Ht 66.0 in | Wt 214.0 lb

## 2016-01-14 DIAGNOSIS — L255 Unspecified contact dermatitis due to plants, except food: Secondary | ICD-10-CM

## 2016-01-14 MED ORDER — PREDNISONE 10 MG PO TABS: ORAL_TABLET | ORAL | Status: DC

## 2016-01-14 MED ORDER — METHYLPREDNISOLONE ACETATE 80 MG/ML IJ SUSP
80.0000 mg | Freq: Once | INTRAMUSCULAR | Status: AC
  Administered 2016-01-14: 80 mg via INTRAMUSCULAR

## 2016-01-14 NOTE — Addendum Note (Signed)
Addended by: Phillips OdorSIX, Terrica Duecker H on: 01/14/2016 03:10 PM   Modules accepted: Orders

## 2016-01-14 NOTE — Patient Instructions (Signed)
Shot given Start prednisone  F/U as needed

## 2016-01-14 NOTE — Progress Notes (Signed)
Patient ID: Monica Stein, female   DOB: 02/15/1976, 40 y.o.   MRN: [id number]    Subjective:    Patient ID: Monica Stein, female    DOB: 07/08/1976, 40 y.o.   MRN: [id number]  Patient presents for Skin Irritation  Patient with poison ivy on her bilateral legs up into the groin area now on her face and arms. She was exposed over the weekend. She is using topical Benadryl cortisone with no improvement. Denies any difficulty breathing or swallowing.    Review Of Systems:  GEN- denies fatigue, fever, weight loss,weakness, recent illness HEENT- denies eye drainage, change in vision, nasal discharge, CVS- denies chest pain, palpitations RESP- denies SOB, cough, wheeze ABD- denies N/V, change in stools, abd pain GU- denies dysuria, hematuria, dribbling, incontinence MSK- denies joint pain, muscle aches, injury Neuro- denies headache, dizziness, syncope, seizure activity       Objective:    BP 118/64 mmHg  Pulse 72  Temp(Src) 98.3 F (36.8 C) (Oral)  Resp 14  Ht 5\' 6"  (1.676 m)  Wt 214 lb (97.07 kg)  BMI 34.56 kg/m2  LMP 12/18/2015 (Exact Date) GEN- NAD, alert and oriented x3 Skin- erythematous maculopapular lesions in liner fashion on legs, scattered spots with some blistering on left flank, scattered lesion on right chin        Assessment & Plan:      Problem List Items Addressed This Visit    None    Visit Diagnoses    Dermatitis due to plants, including poison ivy, sumac, and oak    -  Primary    Treat with Depo Medrol 80mg  IM x 1, given prednisone taper       Note: This dictation was prepared with Dragon dictation along with smaller phrase technology. Any transcriptional errors that result from this process are unintentional.

## 2016-01-21 ENCOUNTER — Encounter: Payer: Self-pay | Admitting: Family Medicine

## 2016-01-21 ENCOUNTER — Ambulatory Visit (INDEPENDENT_AMBULATORY_CARE_PROVIDER_SITE_OTHER): Payer: BLUE CROSS/BLUE SHIELD | Admitting: Family Medicine

## 2016-01-21 ENCOUNTER — Other Ambulatory Visit: Payer: Self-pay | Admitting: *Deleted

## 2016-01-21 VITALS — BP 118/64 | HR 82 | Temp 98.6°F | Resp 14 | Ht 66.0 in | Wt 212.0 lb

## 2016-01-21 DIAGNOSIS — Z0000 Encounter for general adult medical examination without abnormal findings: Secondary | ICD-10-CM | POA: Diagnosis not present

## 2016-01-21 DIAGNOSIS — F411 Generalized anxiety disorder: Secondary | ICD-10-CM

## 2016-01-21 DIAGNOSIS — M170 Bilateral primary osteoarthritis of knee: Secondary | ICD-10-CM

## 2016-01-21 DIAGNOSIS — N938 Other specified abnormal uterine and vaginal bleeding: Secondary | ICD-10-CM

## 2016-01-21 DIAGNOSIS — Z124 Encounter for screening for malignant neoplasm of cervix: Secondary | ICD-10-CM

## 2016-01-21 DIAGNOSIS — G542 Cervical root disorders, not elsewhere classified: Secondary | ICD-10-CM | POA: Diagnosis not present

## 2016-01-21 DIAGNOSIS — Z1239 Encounter for other screening for malignant neoplasm of breast: Secondary | ICD-10-CM

## 2016-01-21 DIAGNOSIS — F3162 Bipolar disorder, current episode mixed, moderate: Secondary | ICD-10-CM | POA: Diagnosis not present

## 2016-01-21 DIAGNOSIS — M5010 Cervical disc disorder with radiculopathy, unspecified cervical region: Secondary | ICD-10-CM

## 2016-01-21 DIAGNOSIS — N72 Inflammatory disease of cervix uteri: Secondary | ICD-10-CM | POA: Diagnosis not present

## 2016-01-21 LAB — CBC WITH DIFFERENTIAL/PLATELET
BASOS ABS: 0 {cells}/uL (ref 0–200)
Basophils Relative: 0 %
EOS ABS: 93 {cells}/uL (ref 15–500)
EOS PCT: 1 %
HCT: 40.9 % (ref 35.0–45.0)
Hemoglobin: 14.1 g/dL (ref 12.0–15.0)
LYMPHS PCT: 32 %
Lymphs Abs: 2976 cells/uL (ref 850–3900)
MCH: 30.4 pg (ref 27.0–33.0)
MCHC: 34.5 g/dL (ref 32.0–36.0)
MCV: 88.1 fL (ref 80.0–100.0)
MONOS PCT: 8 %
MPV: 9.4 fL (ref 7.5–12.5)
Monocytes Absolute: 744 cells/uL (ref 200–950)
NEUTROS ABS: 5487 {cells}/uL (ref 1500–7800)
Neutrophils Relative %: 59 %
Platelets: 238 10*3/uL (ref 140–400)
RBC: 4.64 MIL/uL (ref 3.80–5.10)
RDW: 14 % (ref 11.0–15.0)
WBC: 9.3 10*3/uL (ref 3.8–10.8)

## 2016-01-21 LAB — COMPREHENSIVE METABOLIC PANEL
ALBUMIN: 4.2 g/dL (ref 3.6–5.1)
ALT: 18 U/L (ref 6–29)
AST: 24 U/L (ref 10–30)
Alkaline Phosphatase: 54 U/L (ref 33–115)
BILIRUBIN TOTAL: 0.8 mg/dL (ref 0.2–1.2)
BUN: 15 mg/dL (ref 7–25)
CO2: 25 mmol/L (ref 20–31)
CREATININE: 1.15 mg/dL — AB (ref 0.50–1.10)
Calcium: 9 mg/dL (ref 8.6–10.2)
Chloride: 104 mmol/L (ref 98–110)
GLUCOSE: 89 mg/dL (ref 70–99)
Potassium: 4.1 mmol/L (ref 3.5–5.3)
SODIUM: 139 mmol/L (ref 135–146)
Total Protein: 6.7 g/dL (ref 6.1–8.1)

## 2016-01-21 LAB — LIPID PANEL
Cholesterol: 174 mg/dL (ref 125–200)
HDL: 54 mg/dL (ref >=46)
LDL CALC: 100 mg/dL (ref <130)
Total CHOL/HDL Ratio: 3.2 Ratio (ref <=5.0)
Triglycerides: 102 mg/dL (ref <150)
VLDL: 20 mg/dL (ref <30)

## 2016-01-21 LAB — WET PREP FOR TRICH, YEAST, CLUE
TRICH WET PREP: NONE SEEN
YEAST WET PREP: NONE SEEN

## 2016-01-21 LAB — TSH: TSH: 1.07 m[IU]/L

## 2016-01-21 MED ORDER — FLUCONAZOLE 150 MG PO TABS: 150.0000 mg | ORAL_TABLET | Freq: Once | ORAL | Status: DC

## 2016-01-21 MED ORDER — METRONIDAZOLE 500 MG PO TABS: 500.0000 mg | ORAL_TABLET | Freq: Two times a day (BID) | ORAL | Status: DC

## 2016-01-21 MED ORDER — HYDROCODONE-ACETAMINOPHEN 5-325 MG PO TABS: 1.0000 | ORAL_TABLET | Freq: Four times a day (QID) | ORAL | Status: DC | PRN

## 2016-01-21 MED ORDER — ALPRAZOLAM 0.5 MG PO TABS: 0.5000 mg | ORAL_TABLET | Freq: Two times a day (BID) | ORAL | Status: DC | PRN

## 2016-01-21 NOTE — Assessment & Plan Note (Signed)
OA knee, chronic neck pain refilled norco Continue lyrica

## 2016-01-21 NOTE — Assessment & Plan Note (Signed)
Continue Celexa will also refill her Xanax and get her point with the psychiatrist she has been on multiple medications continue to have both depressed mood and anxiety feels like her mood is swinging from time to time.

## 2016-01-21 NOTE — Patient Instructions (Addendum)
Referral to psychiatry  MRI of C spine  Pain medication refilled

## 2016-01-21 NOTE — Progress Notes (Signed)
Patient ID: Monica Stein, female   DOB: 06/28/1976, 40 y.o.   MRN: [id number]    Subjective:    Patient ID: Monica GrillMisty Lynn Stein, female    DOB: 05/18/1976, 40 y.o.   MRN: [id number]  Patient presents for CPE with PAP Patient here for complete physical exam. She's not had a Pap smear in the past 6 years. She is due for mammogram. Immunizations are up-to-date.  She's been having irregular cycles since she was a teenager she has heavy bleeding spotting cramps. She is also have recurrent vaginitis. In the past she was followed by GYN states that she was given some type of hormone shot but then she lost her insurance she was told that it does not work she would need hysterectomy.  She has history of bipolar disorder along with generalized anxiety. She's currently on Celexa and Xanax however she finds some still having mood swings she seems very stressed out and cannot pinpoint why. She's been on multiple medications for bipolar and anxiety in the past. She does not recall the names of the mood stabilizer.  She continues to have left-sided neck pain tingling and numbness in her hands left greater than right. She did have nerve conduction done which showed mild carpal tunnel also left-sided cervical radiculopathy. She was told the past she had bulging disc in her neck. The last CT scan from some years back did not show any disc bulge. She does have some mild facet arthropathy in her lumbar spine she has osteoarthritis in her knee.    Review Of Systems:  GEN- denies fatigue, fever, weight loss,weakness, recent illness HEENT- denies eye drainage, change in vision, nasal discharge, CVS- denies chest pain, palpitations RESP- denies SOB, cough, wheeze ABD- denies N/V, change in stools, abd pain GU- denies dysuria, hematuria, dribbling, incontinence MSK-+ joint pain, muscle aches, injury Neuro- denies headache, dizziness, syncope, seizure activity       Objective:    BP 118/64 mmHg  Pulse 82   Temp(Src) 98.6 F (37 C) (Oral)  Resp 14  Ht 5\' 6"  (1.676 m)  Wt 212 lb (96.163 kg)  BMI 34.23 kg/m2  LMP 01/14/2016 (Approximate) GEN- NAD, alert and oriented x3 HEENT- PERRL, EOMI, non injected sclera, pink conjunctiva, MMM, oropharynx clear Neck- Supple, no thyromegaly, decreased ROM, +spurlings left side  Breast- normal symmetry, no nipple inversion,no nipple drainage, no nodules or lumps felt Nodes- no axillary nodes CVS- RRR, no murmur RESP-CTAB ABD-NABS,soft,NT,ND Psych- normal affect and mood  Neuro- CNII-XII intact normal tone UE, decreased sensation in left finger tips, strength equal bilat GU- normal external genitalia, vaginal mucosa pink and moist, cervix visualized no growth,+ blood form os, minimal thin clear discharge, no CMT, no ovarian masses, uterus normal size EXT- No edema Pulses- Radial, DP- 2+        Assessment & Plan:      Problem List Items Addressed This Visit    OA (osteoarthritis) of knee    OA knee, chronic neck pain refilled norco Continue lyrica      Relevant Medications   HYDROcodone-acetaminophen (NORCO) 5-325 MG tablet   GAD (generalized anxiety disorder) - Primary   Relevant Orders   Ambulatory referral to Psychiatry   Bipolar 1 disorder, mixed, moderate (HCC)    Continue Celexa will also refill her Xanax and get her point with the psychiatrist she has been on multiple medications continue to have both depressed mood and anxiety feels like her mood is swinging from time to time.  Relevant Orders   Ambulatory referral to Psychiatry    Other Visit Diagnoses    Routine general medical examination at a health care facility        CPE done, fasting labs, PAP smear, schedule mammogram    Relevant Orders    CBC with Differential/Platelet    Comprehensive metabolic panel    Lipid panel    TSH    DUB (dysfunctional uterine bleeding)        Irregular cycles, history of recurret vaginitis, check hormone levels, PAP done, review old  records from GYN about hormone shot    Relevant Orders    FSH/LH    WET PREP FOR TRICH, YEAST, CLUE    Breast cancer screening        Relevant Orders    MM DIGITAL SCREENING BILATERAL    Pap smear for cervical cancer screening        Relevant Orders    PAP, ThinPrep ASCUS Rflx HPV Rflx Type    Cervical nerve root impingement        Cervical disc disorder with radiculopathy of cervical region        known disc bulge many years ago, impingment and neuropathy seen on nerve conduction, not improved with Lyrica, MRI to be done       Note: This dictation was prepared with Dragon dictation along with smaller phrase technology. Any transcriptional errors that result from this process are unintentional.

## 2016-01-22 LAB — PAP THINPREP ASCUS RFLX HPV RFLX TYPE

## 2016-01-22 LAB — FSH/LH
FSH: 8.5 m[IU]/mL
LH: 4 m[IU]/mL

## 2016-01-24 ENCOUNTER — Other Ambulatory Visit: Payer: Self-pay | Admitting: Family Medicine

## 2016-01-26 ENCOUNTER — Other Ambulatory Visit: Payer: BLUE CROSS/BLUE SHIELD

## 2016-01-28 ENCOUNTER — Other Ambulatory Visit: Payer: Self-pay | Admitting: Gastroenterology

## 2016-01-28 NOTE — Telephone Encounter (Signed)
Pt requesting refills on Linzess 

## 2016-01-30 ENCOUNTER — Telehealth: Payer: Self-pay

## 2016-01-30 NOTE — Telephone Encounter (Signed)
Refill request. Incoming fax from [facility] for Protonix 40 mg 1 qd.

## 2016-01-31 MED ORDER — PANTOPRAZOLE SODIUM 40 MG PO TBEC: 40.0000 mg | DELAYED_RELEASE_TABLET | Freq: Every day | ORAL | Status: DC

## 2016-01-31 NOTE — Telephone Encounter (Signed)
Rx refilled as directed by Dr Danis. 

## 2016-01-31 NOTE — Telephone Encounter (Signed)
Yes, please refill for one tablet once daily, Disp #30, RF #6

## 2016-02-01 ENCOUNTER — Ambulatory Visit
Admission: RE | Admit: 2016-02-01 | Discharge: 2016-02-01 | Disposition: A | Discharge status: Home / Self Care | Payer: BLUE CROSS/BLUE SHIELD | Source: Ambulatory Visit | Attending: Family Medicine | Admitting: Family Medicine

## 2016-02-01 DIAGNOSIS — Z1231 Encounter for screening mammogram for malignant neoplasm of breast: Secondary | ICD-10-CM | POA: Diagnosis not present

## 2016-02-01 DIAGNOSIS — Z1239 Encounter for other screening for malignant neoplasm of breast: Secondary | ICD-10-CM

## 2016-02-06 ENCOUNTER — Other Ambulatory Visit: Payer: Self-pay | Admitting: Family Medicine

## 2016-02-07 NOTE — Telephone Encounter (Signed)
Refill appropriate and filled per protocol. 

## 2016-02-12 ENCOUNTER — Ambulatory Visit
Admission: RE | Admit: 2016-02-12 | Discharge: 2016-02-12 | Disposition: A | Discharge status: Home / Self Care | Payer: BLUE CROSS/BLUE SHIELD | Source: Ambulatory Visit | Attending: Family Medicine | Admitting: Family Medicine

## 2016-02-12 ENCOUNTER — Encounter: Payer: Self-pay | Admitting: Family Medicine

## 2016-02-12 ENCOUNTER — Ambulatory Visit (INDEPENDENT_AMBULATORY_CARE_PROVIDER_SITE_OTHER): Payer: BLUE CROSS/BLUE SHIELD | Admitting: Family Medicine

## 2016-02-12 VITALS — BP 124/72 | HR 78 | Temp 98.6°F | Resp 14 | Ht 66.0 in | Wt 208.0 lb

## 2016-02-12 DIAGNOSIS — M5432 Sciatica, left side: Secondary | ICD-10-CM

## 2016-02-12 DIAGNOSIS — M5136 Other intervertebral disc degeneration, lumbar region: Secondary | ICD-10-CM

## 2016-02-12 DIAGNOSIS — M50222 Other cervical disc displacement at C5-C6 level: Secondary | ICD-10-CM | POA: Diagnosis not present

## 2016-02-12 DIAGNOSIS — G542 Cervical root disorders, not elsewhere classified: Secondary | ICD-10-CM

## 2016-02-12 DIAGNOSIS — M5010 Cervical disc disorder with radiculopathy, unspecified cervical region: Secondary | ICD-10-CM

## 2016-02-12 MED ORDER — HYDROCODONE-ACETAMINOPHEN 5-325 MG PO TABS: 1.0000 | ORAL_TABLET | Freq: Four times a day (QID) | ORAL | Status: DC | PRN

## 2016-02-12 MED ORDER — METHYLPREDNISOLONE ACETATE 80 MG/ML IJ SUSP
80.0000 mg | Freq: Once | INTRAMUSCULAR | Status: AC
  Administered 2016-02-12: 80 mg via INTRAMUSCULAR

## 2016-02-12 MED ORDER — METHYLPREDNISOLONE 4 MG PO TBPK: ORAL_TABLET | ORAL | Status: DC

## 2016-02-12 MED ORDER — CYCLOBENZAPRINE HCL 10 MG PO TABS: 10.0000 mg | ORAL_TABLET | Freq: Three times a day (TID) | ORAL | Status: DC | PRN

## 2016-02-12 NOTE — Patient Instructions (Addendum)
Take steroids Take the pain medication  Flexeril as prescribed  F/U as previous

## 2016-02-12 NOTE — Progress Notes (Signed)
Patient ID: Monica Stein, female   DOB: 11/09/1975, 40 y.o.   MRN: [id number]    Subjective:    Patient ID: Monica Stein, female    DOB: 04/25/1976, 40 y.o.   MRN: [id number]  Patient presents for B Sided Lumbar Back Pain Is here with bilateral low back pain with mild radiation to the left side for the past 5 days. She has known degenerative disease also has chronic knee pain had a MRI this morning for her cervical radiculopathy. She remembers just taking a walk last Thursday otherwise no particular injury. She's been taking her hydrocodone which helps she's not using any heat or ice. 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 Denies any change in bowel or bladder states it is very painful to sit or stand long periods of time     Review Of Systems:  GEN- denies fatigue, fever, weight loss,weakness, recent illness HEENT- denies eye drainage, change in vision, nasal discharge, CVS- denies chest pain, palpitations RESP- denies SOB, cough, wheeze ABD- denies N/V, change in stools, abd pain GU- denies dysuria, hematuria, dribbling, incontinence MSK- + joint pain, +muscle aches, injury Neuro- denies headache, dizziness, syncope, seizure activity       Objective:    BP 124/72 mmHg  Pulse 78  Temp(Src) 98.6 F (37 C) (Oral)  Resp 14  Ht 5\' 6"  (1.676 m)  Wt 208 lb (94.348 kg)  BMI 33.59 kg/m2  LMP 01/14/2016 (Approximate) GEN- NAD, alert and oriented x3 MSK- TTP lumbar spine, +spasm, decreased ROM, equiviocal SLR Left side  Neuro- sensation grossly in tact LE, tone equal, DTR equal, decreased strength L >r ? Due to pain, antalgic stiff gait Ext- no edema         Assessment & Plan:      Problem List Items Addressed This Visit    DDD (degenerative disc disease), lumbar   Relevant Medications   methylPREDNISolone (MEDROL DOSEPAK) 4 MG TBPK tablet   HYDROcodone-acetaminophen (NORCO) 5-325 MG tablet   cyclobenzaprine (FLEXERIL) 10  MG tablet   methylPREDNISolone acetate (DEPO-MEDROL) injection 80 mg (Completed)    Other Visit Diagnoses    Left sided sciatica    -  Primary    Known DDD, with sciatica and spasm noted, treat with Depo Medrol 80mg  IM, Medrol dose, Heat, Pain meds, Flexeril, no red flags hold on imaging to day     Relevant Medications    cyclobenzaprine (FLEXERIL) 10 MG tablet       Note: This dictation was prepared with Dragon dictation along with smaller phrase technology. Any transcriptional errors that result from this process are unintentional.

## 2016-02-14 ENCOUNTER — Other Ambulatory Visit: Payer: Self-pay | Admitting: *Deleted

## 2016-02-14 MED ORDER — TOPAMAX 25 MG PO TABS: ORAL_TABLET | ORAL | Status: DC

## 2016-02-15 ENCOUNTER — Other Ambulatory Visit: Payer: Self-pay | Admitting: *Deleted

## 2016-02-15 MED ORDER — TOPIRAMATE 25 MG PO TABS: ORAL_TABLET | ORAL | Status: DC

## 2016-03-24 ENCOUNTER — Other Ambulatory Visit: Payer: Self-pay | Admitting: Family Medicine

## 2016-03-25 NOTE — Telephone Encounter (Signed)
Refill appropriate and filled per protocol. 

## 2016-04-11 DIAGNOSIS — Z9851 Tubal ligation status: Secondary | ICD-10-CM | POA: Diagnosis not present

## 2016-04-11 DIAGNOSIS — N920 Excessive and frequent menstruation with regular cycle: Secondary | ICD-10-CM | POA: Diagnosis not present

## 2016-04-11 DIAGNOSIS — J45909 Unspecified asthma, uncomplicated: Secondary | ICD-10-CM | POA: Diagnosis not present

## 2016-04-11 DIAGNOSIS — R102 Pelvic and perineal pain: Secondary | ICD-10-CM | POA: Diagnosis not present

## 2016-04-11 DIAGNOSIS — N946 Dysmenorrhea, unspecified: Secondary | ICD-10-CM | POA: Diagnosis not present

## 2016-04-11 DIAGNOSIS — G8929 Other chronic pain: Secondary | ICD-10-CM | POA: Diagnosis not present

## 2016-04-11 DIAGNOSIS — Z9049 Acquired absence of other specified parts of digestive tract: Secondary | ICD-10-CM | POA: Diagnosis not present

## 2016-04-11 DIAGNOSIS — F3162 Bipolar disorder, current episode mixed, moderate: Secondary | ICD-10-CM | POA: Diagnosis not present

## 2016-04-21 DIAGNOSIS — F3162 Bipolar disorder, current episode mixed, moderate: Secondary | ICD-10-CM | POA: Diagnosis not present

## 2016-04-22 ENCOUNTER — Other Ambulatory Visit: Payer: Self-pay | Admitting: Gastroenterology

## 2016-04-25 DIAGNOSIS — F3162 Bipolar disorder, current episode mixed, moderate: Secondary | ICD-10-CM | POA: Diagnosis not present

## 2016-05-02 ENCOUNTER — Other Ambulatory Visit: Payer: Self-pay | Admitting: *Deleted

## 2016-05-02 MED ORDER — TOPIRAMATE 50 MG PO TABS: 50.0000 mg | ORAL_TABLET | Freq: Every day | ORAL | 1 refills | Status: DC

## 2016-05-02 NOTE — Telephone Encounter (Signed)
Received fax requesting refill on topamax.   Refill appropriate and filled per protocol.

## 2016-05-08 ENCOUNTER — Other Ambulatory Visit: Payer: Self-pay | Admitting: *Deleted

## 2016-05-08 MED ORDER — TOPIRAMATE 50 MG PO TABS: 50.0000 mg | ORAL_TABLET | Freq: Every day | ORAL | 1 refills | Status: DC

## 2016-05-08 NOTE — Telephone Encounter (Signed)
Received fax requesting refill on topamax.   Refill appropriate and filled per protocol.

## 2016-06-10 DIAGNOSIS — F3162 Bipolar disorder, current episode mixed, moderate: Secondary | ICD-10-CM | POA: Diagnosis not present

## 2016-06-11 ENCOUNTER — Ambulatory Visit (INDEPENDENT_AMBULATORY_CARE_PROVIDER_SITE_OTHER): Payer: BLUE CROSS/BLUE SHIELD | Admitting: Family Medicine

## 2016-06-11 ENCOUNTER — Encounter: Payer: Self-pay | Admitting: Family Medicine

## 2016-06-11 VITALS — BP 120/62 | HR 84 | Temp 98.2°F | Resp 14 | Ht 66.0 in | Wt 210.0 lb

## 2016-06-11 DIAGNOSIS — M5432 Sciatica, left side: Secondary | ICD-10-CM

## 2016-06-11 DIAGNOSIS — K602 Anal fissure, unspecified: Secondary | ICD-10-CM

## 2016-06-11 DIAGNOSIS — M5136 Other intervertebral disc degeneration, lumbar region: Secondary | ICD-10-CM

## 2016-06-11 DIAGNOSIS — G43709 Chronic migraine without aura, not intractable, without status migrainosus: Secondary | ICD-10-CM

## 2016-06-11 DIAGNOSIS — G43909 Migraine, unspecified, not intractable, without status migrainosus: Secondary | ICD-10-CM | POA: Insufficient documentation

## 2016-06-11 DIAGNOSIS — M5431 Sciatica, right side: Secondary | ICD-10-CM

## 2016-06-11 DIAGNOSIS — M5430 Sciatica, unspecified side: Secondary | ICD-10-CM | POA: Insufficient documentation

## 2016-06-11 MED ORDER — CYCLOBENZAPRINE HCL 10 MG PO TABS: 10.0000 mg | ORAL_TABLET | Freq: Three times a day (TID) | ORAL | 0 refills | Status: DC | PRN

## 2016-06-11 MED ORDER — METHYLPREDNISOLONE 4 MG PO TBPK: ORAL_TABLET | ORAL | 0 refills | Status: DC

## 2016-06-11 MED ORDER — HYDROCODONE-ACETAMINOPHEN 5-325 MG PO TABS: 1.0000 | ORAL_TABLET | Freq: Four times a day (QID) | ORAL | 0 refills | Status: DC | PRN

## 2016-06-11 MED ORDER — TOPIRAMATE 50 MG PO TABS: 50.0000 mg | ORAL_TABLET | Freq: Two times a day (BID) | ORAL | 2 refills | Status: DC

## 2016-06-11 MED ORDER — LIDOCAINE HCL 2 % EX GEL: 1.0000 "application " | CUTANEOUS | 0 refills | Status: DC | PRN

## 2016-06-11 MED ORDER — QUETIAPINE FUMARATE ER 150 MG PO TB24: 450.0000 mg | ORAL_TABLET | Freq: Every day | ORAL | 0 refills | Status: DC

## 2016-06-11 NOTE — Patient Instructions (Signed)
Get Xray of lumbar spine  Take steroids, pain meds as prescribed Topamax increased to twice a day  F/U pending results

## 2016-06-11 NOTE — Progress Notes (Signed)
Subjective:    Patient ID: Monica GrillMisty Lynn Prehn, female    DOB: 12/27/1975, 40 y.o.   MRN: [id number]  Patient presents for Lower Back Pain (x2 days- lumbar pain radiating through buttocks- states that she was doing yard work and noted pain after)   P with recurrent low back pain she was doing some yard work this weekend when she pulled something in her back. Feels like her similar flares with her degenerative disc she also gets sciatic symptoms with sharp pain that runs down her buttocks into her legs. This has been going on for the past 2-3 days. She does not have any medication at home. We did not image back in July when she had the episode but she's been having recurrent back pain since then. Her last set of x-rays were about 3 years ago for fairly unremarkable.  She also complains of increased migraine she would like to increase her Topamax it does help with her headaches.  He also complains of restless legs she gets jump in her legs almost every night this has been going on for over a year  She [facility] recently increased her Seroquel to 450 mg , along with celexa, also uses xanax as needed but more sparingly    Review Of Systems:  GEN- denies fatigue, fever, weight loss,weakness, recent illness HEENT- denies eye drainage, change in vision, nasal discharge, CVS- denies chest pain, palpitations RESP- denies SOB, cough, wheeze ABD- denies N/V, change in stools, abd pain GU- denies dysuria, hematuria, dribbling, incontinence MSK- + joint pain, muscle aches, injury Neuro-+ headache, dizziness, syncope, seizure activity       Objective:    BP 120/62 (BP Location: Left Arm, Patient Position: Sitting, Cuff Size: Large)   Pulse 84   Temp 98.2 F (36.8 C) (Oral)   Resp 14   Ht 5\' 6"  (1.676 m)   Wt 210 lb (95.3 kg)   SpO2 98%   BMI 33.89 kg/m  GEN- NAD, alert and oriented x3 HEENT- PERRL, EOMI, non injected sclera, pink conjunctiva, MMM,  oropharynx clear CVS- RRR, no murmur RESP-CTAB MSK- TTP lumbar spine,  decreased ROM, +  SLR bilat  Neuro- sensation grossly in tact LE, tone equal, DTR equal, Pain with ROMto pain, antalgic stiff gait, CNII-XII intact  Ext- no edema  Pulses- Radial, DP- 2+        Assessment & Plan:      Problem List Items Addressed This Visit    Sciatica   Relevant Medications   QUEtiapine Fumarate (SEROQUEL XR) 150 MG 24 hr tablet   topiramate (TOPAMAX) 50 MG tablet   cyclobenzaprine (FLEXERIL) 10 MG tablet   Other Relevant Orders   DG Lumbar Spine Complete   Migraines    Increase Topamax to 50 mg twice a day      Relevant Medications   topiramate (TOPAMAX) 50 MG tablet   HYDROcodone-acetaminophen (NORCO) 5-325 MG tablet   cyclobenzaprine (FLEXERIL) 10 MG tablet   DDD (degenerative disc disease), lumbar - Primary    Recurrent flares of her back pain with radicular symptoms. We'll start with x-rays of the she's going need an MRI of her lumbar spine. We'll treat again with Depo-Medrol injection as well as prednisone which worked for hydrocodone for severe pain. No change in her bowel or bladder. With regards to the restless legs that she is feeling this may be coming from her back overall achiness. I do not want to start anything  particular issues include on very high-dose of Seroquel and she is on Topamax which I'm increasing to twice a day for the migraines. Also workup her back first      Relevant Medications   methylPREDNISolone (MEDROL DOSEPAK) 4 MG TBPK tablet   HYDROcodone-acetaminophen (NORCO) 5-325 MG tablet   cyclobenzaprine (FLEXERIL) 10 MG tablet   Other Relevant Orders   DG Lumbar Spine Complete   Anal fissure    She also requested lidocaine jelly for her anal fissure         Note: This dictation was prepared with Dragon dictation along with smaller phrase technology. Any transcriptional errors that result from this process are unintentional.

## 2016-06-11 NOTE — Assessment & Plan Note (Signed)
Increase Topamax to 50mg twice a day.

## 2016-06-11 NOTE — Assessment & Plan Note (Signed)
Recurrent flares of her back pain with radicular symptoms. We'll start with x-rays of the she's going need an MRI of her lumbar spine. We'll treat again with Depo-Medrol injection as well as prednisone which worked for hydrocodone for severe pain. No change in her bowel or bladder. With regards to the restless legs that she is feeling this may be coming from her back overall achiness. I do not want to start anything particular issues include on very high-dose of Seroquel and she is on Topamax which I'm increasing to twice a day for the migraines. Also workup her back first

## 2016-06-11 NOTE — Assessment & Plan Note (Signed)
She also requested lidocaine jelly for her anal fissure

## 2016-06-13 MED ORDER — METHYLPREDNISOLONE ACETATE 80 MG/ML IJ SUSP
80.0000 mg | Freq: Once | INTRAMUSCULAR | Status: AC
  Administered 2016-06-11: 80 mg via INTRAMUSCULAR

## 2016-06-13 NOTE — Addendum Note (Signed)
Addended by: Phillips OdorSIX, Jonavan Vanhorn H on: 06/13/2016 02:51 PM   Modules accepted: Orders

## 2016-07-11 DIAGNOSIS — Z3042 Encounter for surveillance of injectable contraceptive: Secondary | ICD-10-CM | POA: Diagnosis not present

## 2016-08-05 DIAGNOSIS — F3162 Bipolar disorder, current episode mixed, moderate: Secondary | ICD-10-CM | POA: Diagnosis not present

## 2016-08-13 ENCOUNTER — Other Ambulatory Visit: Payer: Self-pay | Admitting: Gastroenterology

## 2016-08-13 NOTE — Telephone Encounter (Signed)
Refill request for Protonix 40 mg 1 a day. Last seen in clinic on 11-22-2015

## 2016-08-16 ENCOUNTER — Other Ambulatory Visit: Payer: Self-pay | Admitting: Family Medicine

## 2016-08-27 DIAGNOSIS — F3162 Bipolar disorder, current episode mixed, moderate: Secondary | ICD-10-CM | POA: Diagnosis not present

## 2016-09-05 DIAGNOSIS — F3162 Bipolar disorder, current episode mixed, moderate: Secondary | ICD-10-CM | POA: Diagnosis not present

## 2016-09-15 ENCOUNTER — Encounter: Payer: Self-pay | Admitting: *Deleted

## 2016-10-09 DIAGNOSIS — N946 Dysmenorrhea, unspecified: Secondary | ICD-10-CM | POA: Diagnosis not present

## 2016-10-09 DIAGNOSIS — Z79818 Long term (current) use of other agents affecting estrogen receptors and estrogen levels: Secondary | ICD-10-CM | POA: Diagnosis not present

## 2016-10-14 ENCOUNTER — Other Ambulatory Visit: Payer: Self-pay

## 2016-10-14 ENCOUNTER — Telehealth: Payer: Self-pay

## 2016-10-14 MED ORDER — PANTOPRAZOLE SODIUM 40 MG PO TBEC: 40.0000 mg | DELAYED_RELEASE_TABLET | Freq: Every day | ORAL | 2 refills | Status: DC

## 2016-10-14 NOTE — Telephone Encounter (Signed)
Left a detailed voicemail regarding Dr Danis' request for a follow up for additional refills. 

## 2016-10-14 NOTE — Telephone Encounter (Signed)
Yes, can be refilled for 3 months and she should see me sometime before needs another refill

## 2016-10-14 NOTE — Progress Notes (Unsigned)
Left a detailed voicemail regarding Dr Myrtie Neitheranis' request for a follow up for additional refills.

## 2016-10-14 NOTE — Telephone Encounter (Signed)
Incoming [facility] from [facility] Pro[facility] 40 mg one a day. Last seen 11-2015.

## 2016-10-15 DIAGNOSIS — F3162 Bipolar disorder, current episode mixed, moderate: Secondary | ICD-10-CM | POA: Diagnosis not present

## 2016-11-03 ENCOUNTER — Ambulatory Visit (INDEPENDENT_AMBULATORY_CARE_PROVIDER_SITE_OTHER): Payer: BLUE CROSS/BLUE SHIELD | Admitting: Family Medicine

## 2016-11-03 ENCOUNTER — Encounter: Payer: Self-pay | Admitting: Family Medicine

## 2016-11-03 VITALS — BP 118/72 | HR 86 | Temp 97.9°F | Resp 12 | Ht 66.0 in | Wt 227.0 lb

## 2016-11-03 DIAGNOSIS — R7301 Impaired fasting glucose: Secondary | ICD-10-CM | POA: Diagnosis not present

## 2016-11-03 DIAGNOSIS — R202 Paresthesia of skin: Secondary | ICD-10-CM | POA: Diagnosis not present

## 2016-11-03 DIAGNOSIS — R609 Edema, unspecified: Secondary | ICD-10-CM | POA: Diagnosis not present

## 2016-11-03 DIAGNOSIS — Z6836 Body mass index (BMI) 36.0-36.9, adult: Secondary | ICD-10-CM | POA: Diagnosis not present

## 2016-11-03 DIAGNOSIS — E6609 Other obesity due to excess calories: Secondary | ICD-10-CM | POA: Diagnosis not present

## 2016-11-03 DIAGNOSIS — K648 Other hemorrhoids: Secondary | ICD-10-CM

## 2016-11-03 DIAGNOSIS — G43709 Chronic migraine without aura, not intractable, without status migrainosus: Secondary | ICD-10-CM | POA: Diagnosis not present

## 2016-11-03 DIAGNOSIS — K589 Irritable bowel syndrome without diarrhea: Secondary | ICD-10-CM

## 2016-11-03 MED ORDER — HYDROCHLOROTHIAZIDE 12.5 MG PO CAPS: 12.5000 mg | ORAL_CAPSULE | Freq: Every day | ORAL | 0 refills | Status: DC

## 2016-11-03 MED ORDER — SUMATRIPTAN SUCCINATE 100 MG PO TABS: 100.0000 mg | ORAL_TABLET | ORAL | 1 refills | Status: DC | PRN

## 2016-11-03 NOTE — Progress Notes (Signed)
Subjective:    Patient ID: Monica Stein, female    DOB: 12-19-1975, 41 y.o.   MRN: [id number]  Patient presents for L Foot Numbness (intermittent bouts of tingling and numbness in L foot ) and Frequent HA   Migraines- Topamax was increased to  twice a day at last visit in Nov 2017, still gets headache every other day   Left foot numbness and tingling, has had left sided sciatica symptoms on and off > 1 year, has history of chronic back pain. She has been treated a few times with steroid dosepak, due to only 1 functioning kidney and pain medictions, At last visit in Nov 2017 xrays were to be done followed by MRI due to her radicular symptoms down left side but this was not done.  She denies any back pain today, but has more tingling numbness Related in her left foot. She's also had swelling in both feet mostly near ankles and occ her hands.She did have Seroquel increased to 800 mg feels like this helps her mood more than anything therefore does not want to change. Her psychiatrist did do some labs she did have elevated fasting glucose renal function was normal. There was also concern for possible restless leg syndrome therefore he put her on iron her iron stores and hemoglobin were normal and she did not seen improvement with this.  She is worried about her weight as this is been increasing over the past year. She cannot exercise very much due to her chronic back pain she states she tries to watch which eats it is still overeating with carbs on something to try to help curb her appetite.    Review Of Systems:  GEN- denies fatigue, fever, weight loss,weakness, recent illness HEENT- denies eye drainage, change in vision, nasal discharge, CVS- denies chest pain, palpitations RESP- denies SOB, cough, wheeze ABD- denies N/V, change in stools, abd pain GU- denies dysuria, hematuria, dribbling, incontinence MSK- + joint pain, muscle aches, injury Neuro- denies headache, dizziness, syncope,  seizure activity       Objective:    BP 118/72   Pulse 86   Temp 97.9 F (36.6 C) (Oral)   Resp 12   Ht  (1.676 m)   Wt 227 lb (103 kg)   SpO2 98%   BMI 36.64 kg/m  GEN- NAD, alert and oriented x3,obese  HEENT- PERRL, EOMI, non injected sclera, pink conjunctiva, MMM, oropharynx clear CVS- RRR, no murmur RESP-CTAB ABD-NABS,soft,NT,ND EXT- trace bilat edema L >r , no swelling in hands noted  Neuro- sensation grossly intact LE, normal tone, good ROM LE   Pulses- Radial, DP- 2+        Assessment & Plan:      Problem List Items Addressed This Visit    Obesity   Migraines - Primary    Continue Topamax at current dose we'll give her Imitrex use as needed for migraines. No concern about increasing the dose of these medications in the setting of her high doses of her psychiatric medications.      Relevant Medications   citalopram (CELEXA) 10 MG tablet   SUMAtriptan (IMITREX) 100 MG tablet   hydrochlorothiazide (MICROZIDE) 12.5 MG capsule   IBS (irritable bowel syndrome)    She has be referred to a new gastroenterologist that she felt uncomfortable at her last visit. Referral was placed      Relevant Medications   Probiotic Product (PROBIOTIC PO)   Other Relevant Orders   Ambulatory referral to Gastroenterology  Hemorrhoids, internal, with bleeding and prolapse   Relevant Medications   hydrochlorothiazide (MICROZIDE) 12.5 MG capsule   Other Relevant Orders   Ambulatory referral to Gastroenterology    Other Visit Diagnoses    Elevated fasting glucose       Check A1c and see if this is contributing to the weight gain and the swelling. Also check some other labs for the paresthesias. For her swelling give low dose Hctz For 1 week as she does have following 1 functioning kidney. We'll see if this helped resolve some of the swelling. No sign of any injury to her foot and it is bilateral at the ankles. I think there is some fluid retention likely from her medications  but also from her dietary habits. We'll have her cut back on her sodium in the carbs as well. With regards to the obesity this is very difficult as her medications are likely contribute to weight gain as well as her poor eating habits. Concern with her elevated glucose we'll check it A1c and see with this level looks like. We did discuss Saxenda for weight loss as the others would interfere with her psychiatric medications.    Relevant Orders   Hemoglobin A1c (Completed)   Basic metabolic panel (Completed)   Paresthesia of left foot       Relevant Orders   Vitamin B12 (Completed)   Peripheral edema       Relevant Orders   TSH (Completed)      Note: This dictation was prepared with Dragon dictation along with smaller phrase technology. Any transcriptional errors that result from this process are unintentional.

## 2016-11-03 NOTE — Patient Instructions (Addendum)
Take HCTZ once a day for a week Then imitrex for migraines  We will call with labs  Referral to [facility]  F/U pending results

## 2016-11-04 ENCOUNTER — Encounter: Payer: Self-pay | Admitting: Family Medicine

## 2016-11-04 DIAGNOSIS — E669 Obesity, unspecified: Secondary | ICD-10-CM | POA: Insufficient documentation

## 2016-11-04 LAB — BASIC METABOLIC PANEL
BUN: 16 mg/dL (ref 7–25)
CALCIUM: 8.8 mg/dL (ref 8.6–10.2)
CO2: 21 mmol/L (ref 20–31)
CREATININE: 1.15 mg/dL — AB (ref 0.50–1.10)
Chloride: 110 mmol/L (ref 98–110)
GLUCOSE: 108 mg/dL — AB (ref 70–99)
Potassium: 4 mmol/L (ref 3.5–5.3)
Sodium: 140 mmol/L (ref 135–146)

## 2016-11-04 LAB — TSH: TSH: 0.68 m[IU]/L

## 2016-11-04 LAB — VITAMIN B12: Vitamin B-12: 530 pg/mL (ref 200–1100)

## 2016-11-04 NOTE — Assessment & Plan Note (Signed)
She has be referred to a new gastroenterologist that she felt uncomfortable at her last visit. Referral was placed

## 2016-11-04 NOTE — Assessment & Plan Note (Signed)
Continue Topamax at current dose we'll give her Imitrex use as needed for migraines. No concern about increasing the dose of these medications in the setting of her high doses of her psychiatric medications.

## 2016-11-05 LAB — HEMOGLOBIN A1C
Hgb A1c MFr Bld: 5.1 % (ref <5.7)
Mean Plasma Glucose: 100 mg/dL

## 2016-11-13 ENCOUNTER — Other Ambulatory Visit: Payer: Self-pay | Admitting: *Deleted

## 2016-11-13 MED ORDER — LIRAGLUTIDE -WEIGHT MANAGEMENT 18 MG/3ML ~~LOC~~ SOPN: 0.6000 mg | PEN_INJECTOR | Freq: Every day | SUBCUTANEOUS | 1 refills | Status: DC

## 2016-11-14 ENCOUNTER — Telehealth: Payer: Self-pay | Admitting: *Deleted

## 2016-11-14 NOTE — Telephone Encounter (Signed)
Please wait for [employer] [place] [employer] [facility] to return a determination.

## 2016-11-14 NOTE — Telephone Encounter (Signed)
Received request from pharmacy for PA on Saxenda.   PA submitted.   Dx: E66.9- obesity. 

## 2016-11-17 MED ORDER — INSULIN PEN NEEDLE 32G X 6 MM MISC
1 refills | Status: DC
Start: 2016-11-17 — End: 2017-06-02

## 2016-11-17 NOTE — Telephone Encounter (Signed)
Received PA determination.  ° °PA approved.  ° °Pharmacy made aware.  °

## 2016-11-30 ENCOUNTER — Other Ambulatory Visit: Payer: Self-pay | Admitting: Family Medicine

## 2016-12-01 NOTE — Telephone Encounter (Signed)
Okay to refill? 

## 2016-12-01 NOTE — Telephone Encounter (Signed)
Medication called to pharmacy. 

## 2016-12-01 NOTE — Telephone Encounter (Signed)
Ok to refill??  Last office visit 11/03/2016.  Last refill 01/21/2016, #1 refills.

## 2016-12-04 DIAGNOSIS — R194 Change in bowel habit: Secondary | ICD-10-CM | POA: Diagnosis not present

## 2016-12-04 DIAGNOSIS — R140 Abdominal distension (gaseous): Secondary | ICD-10-CM | POA: Diagnosis not present

## 2016-12-04 DIAGNOSIS — R1010 Upper abdominal pain, unspecified: Secondary | ICD-10-CM | POA: Diagnosis not present

## 2016-12-04 DIAGNOSIS — K625 Hemorrhage of anus and rectum: Secondary | ICD-10-CM | POA: Diagnosis not present

## 2016-12-08 ENCOUNTER — Other Ambulatory Visit: Payer: Self-pay | Admitting: Family Medicine

## 2016-12-09 ENCOUNTER — Other Ambulatory Visit: Payer: Self-pay | Admitting: *Deleted

## 2016-12-09 ENCOUNTER — Other Ambulatory Visit: Payer: Self-pay | Admitting: Gastroenterology

## 2016-12-09 MED ORDER — HYDROCHLOROTHIAZIDE 12.5 MG PO CAPS: 12.5000 mg | ORAL_CAPSULE | Freq: Every day | ORAL | 0 refills | Status: DC

## 2016-12-10 DIAGNOSIS — F3162 Bipolar disorder, current episode mixed, moderate: Secondary | ICD-10-CM | POA: Diagnosis not present

## 2016-12-23 DIAGNOSIS — K582 Mixed irritable bowel syndrome: Secondary | ICD-10-CM | POA: Diagnosis not present

## 2016-12-26 ENCOUNTER — Ambulatory Visit (INDEPENDENT_AMBULATORY_CARE_PROVIDER_SITE_OTHER): Payer: BLUE CROSS/BLUE SHIELD | Admitting: Family Medicine

## 2016-12-26 ENCOUNTER — Encounter: Payer: Self-pay | Admitting: Family Medicine

## 2016-12-26 VITALS — BP 120/70 | HR 82 | Temp 98.3°F | Resp 14 | Ht 66.0 in | Wt 216.0 lb

## 2016-12-26 DIAGNOSIS — M5136 Other intervertebral disc degeneration, lumbar region: Secondary | ICD-10-CM

## 2016-12-26 DIAGNOSIS — K3189 Other diseases of stomach and duodenum: Secondary | ICD-10-CM | POA: Diagnosis not present

## 2016-12-26 DIAGNOSIS — E6609 Other obesity due to excess calories: Secondary | ICD-10-CM

## 2016-12-26 DIAGNOSIS — K2930 Chronic superficial gastritis without bleeding: Secondary | ICD-10-CM | POA: Diagnosis not present

## 2016-12-26 DIAGNOSIS — R197 Diarrhea, unspecified: Secondary | ICD-10-CM | POA: Diagnosis not present

## 2016-12-26 DIAGNOSIS — R1010 Upper abdominal pain, unspecified: Secondary | ICD-10-CM | POA: Diagnosis not present

## 2016-12-26 DIAGNOSIS — Z6834 Body mass index (BMI) 34.0-34.9, adult: Secondary | ICD-10-CM

## 2016-12-26 DIAGNOSIS — R140 Abdominal distension (gaseous): Secondary | ICD-10-CM | POA: Diagnosis not present

## 2016-12-26 NOTE — Patient Instructions (Addendum)
F/U 2 months  Get the xray of your spine

## 2016-12-26 NOTE — Progress Notes (Signed)
   Subjective:    Patient ID: Monica Stein, female    DOB: 12/27/1975, 41 y.o.   MRN: [id number]  Patient presents for Follow-up (weight loss)  Patient here to follow-up weight loss. She was seen on April 2 at that time she was started on Saxenda.  She has been on the medication for the past 6-7weeks and has lost 11lbs.  She has had some nausea but is not sure if this is related to her dysphasia she is seeing gastroenterology has an EGD to be performed this afternoon. She is trying to sick to a better diet but admits to some cheats recently. Denies any change in her bowels. No other side effects from the medication. She is happy to see her weight decreasing. She's also had some history of chronic back pain with degenerative disc disease or lumbar spine she was unable to get her x-ray previously she would like to proceed with getting this done.  Review Of Systems:  GEN- denies fatigue, fever, weight loss,weakness, recent illness HEENT- denies eye drainage, change in vision, nasal discharge, CVS- denies chest pain, palpitations RESP- denies SOB, cough, wheeze ABD- denies N/V, change in stools, abd pain GU- denies dysuria, hematuria, dribbling, incontinence MSK- +joint pain, muscle aches, injury Neuro- denies headache, dizziness, syncope, seizure activity       Objective:    BP 120/70   Pulse 82   Temp 98.3 F (36.8 C) (Oral)   Resp 14   Ht 5\' 6"  (1.676 m)   Wt 216 lb (98 kg)   SpO2 99%   BMI 34.86 kg/m  GEN- NAD, alert and oriented x3 CVS- RRR, no murmur RESP-CTAB ABD-NABS,soft,NT,ND  EXT- No edema Pulses- Radial  2+        Assessment & Plan:      Problem List Items Addressed This Visit    Obesity - Primary    She's had good weight loss with medication will continue. She has a follow-up with GI for this afternoon for her EGD hopefully she'll be able to get back on her regular diet. We also discussed exercise she is to start with yoga secondary to her back pain and  other joint pains. She will also proceed with kidney x-ray to evaluate her lower lumbar spine.      DDD (degenerative disc disease), lumbar      Note: This dictation was prepared with Dragon dictation along with smaller phrase technology. Any transcriptional errors that result from this process are unintentional.

## 2016-12-26 NOTE — Assessment & Plan Note (Signed)
She's had good weight loss with medication will continue. She has a follow-up with GI for this afternoon for her EGD hopefully she'll be able to get back on her regular diet. We also discussed exercise she is to start with yoga secondary to her back pain and other joint pains. She will also proceed with kidney x-ray to evaluate her lower lumbar spine.

## 2016-12-31 ENCOUNTER — Other Ambulatory Visit (HOSPITAL_COMMUNITY): Payer: Self-pay | Admitting: Gastroenterology

## 2016-12-31 DIAGNOSIS — R1010 Upper abdominal pain, unspecified: Secondary | ICD-10-CM

## 2017-01-02 DIAGNOSIS — K2930 Chronic superficial gastritis without bleeding: Secondary | ICD-10-CM | POA: Diagnosis not present

## 2017-01-09 ENCOUNTER — Other Ambulatory Visit: Payer: Self-pay | Admitting: Internal Medicine

## 2017-01-20 ENCOUNTER — Other Ambulatory Visit: Payer: Self-pay | Admitting: Family Medicine

## 2017-01-20 ENCOUNTER — Ambulatory Visit
Admission: RE | Admit: 2017-01-20 | Discharge: 2017-01-20 | Disposition: A | Discharge status: Home / Self Care | Payer: BLUE CROSS/BLUE SHIELD | Source: Ambulatory Visit | Attending: Family Medicine | Admitting: Family Medicine

## 2017-01-20 DIAGNOSIS — M5136 Other intervertebral disc degeneration, lumbar region: Secondary | ICD-10-CM

## 2017-01-20 DIAGNOSIS — M545 Low back pain: Secondary | ICD-10-CM | POA: Diagnosis not present

## 2017-01-20 DIAGNOSIS — M5431 Sciatica, right side: Secondary | ICD-10-CM

## 2017-01-20 DIAGNOSIS — M5432 Sciatica, left side: Secondary | ICD-10-CM

## 2017-01-26 ENCOUNTER — Ambulatory Visit (HOSPITAL_COMMUNITY)
Admission: RE | Admit: 2017-01-26 | Discharge: 2017-01-26 | Disposition: A | Discharge status: Home / Self Care | Payer: BLUE CROSS/BLUE SHIELD | Source: Ambulatory Visit | Attending: Gastroenterology | Admitting: Gastroenterology

## 2017-01-26 DIAGNOSIS — R1010 Upper abdominal pain, unspecified: Secondary | ICD-10-CM | POA: Insufficient documentation

## 2017-01-26 DIAGNOSIS — K30 Functional dyspepsia: Secondary | ICD-10-CM | POA: Insufficient documentation

## 2017-01-26 DIAGNOSIS — R109 Unspecified abdominal pain: Secondary | ICD-10-CM | POA: Diagnosis not present

## 2017-01-26 MED ORDER — TECHNETIUM TC 99M SULFUR COLLOID
2.0300 | Freq: Once | INTRAVENOUS | Status: AC | PRN
  Administered 2017-01-26: 2.03 via INTRAVENOUS

## 2017-01-29 ENCOUNTER — Other Ambulatory Visit: Payer: Self-pay | Admitting: Family Medicine

## 2017-02-12 DIAGNOSIS — K581 Irritable bowel syndrome with constipation: Secondary | ICD-10-CM | POA: Diagnosis not present

## 2017-02-12 DIAGNOSIS — K3184 Gastroparesis: Secondary | ICD-10-CM | POA: Diagnosis not present

## 2017-03-02 ENCOUNTER — Ambulatory Visit: Payer: BLUE CROSS/BLUE SHIELD | Admitting: Family Medicine

## 2017-03-10 ENCOUNTER — Other Ambulatory Visit: Payer: Self-pay | Admitting: Family Medicine

## 2017-03-13 ENCOUNTER — Telehealth: Payer: Self-pay | Admitting: *Deleted

## 2017-03-13 NOTE — Telephone Encounter (Signed)
Your information has been submitted to [employer] [place]. [employer] [place] will review the request and fax you a determination directly, typically within 3 business days of your submission once all necessary information is received.  If [employer] [place] has not responded in 3 business days or if you have any questions about your submission, contact [employer] [place] at [phone]. 

## 2017-03-13 NOTE — Telephone Encounter (Signed)
Received request from pharmacy for PA on Saxenda.  PA submitted.   Dx: E66.01- Obesity.  11/03/2016 weight: 227lb 12/26/2016 weight: 216lb % weight loss: 4.85%

## 2017-03-16 MED ORDER — LIRAGLUTIDE -WEIGHT MANAGEMENT 18 MG/3ML ~~LOC~~ SOPN: 3.0000 mg | PEN_INJECTOR | Freq: Every day | SUBCUTANEOUS | 3 refills | Status: DC

## 2017-03-16 NOTE — Telephone Encounter (Signed)
Received PA determination.   PA Approved 03/13/2017- 03/12/2018.  Pharmacy made aware.

## 2017-03-23 ENCOUNTER — Other Ambulatory Visit: Payer: Self-pay | Admitting: Family Medicine

## 2017-03-23 ENCOUNTER — Ambulatory Visit
Admission: RE | Admit: 2017-03-23 | Discharge: 2017-03-23 | Disposition: A | Discharge status: Home / Self Care | Payer: BLUE CROSS/BLUE SHIELD | Source: Ambulatory Visit | Attending: Family Medicine | Admitting: Family Medicine

## 2017-03-23 DIAGNOSIS — K5904 Chronic idiopathic constipation: Secondary | ICD-10-CM | POA: Diagnosis not present

## 2017-03-23 DIAGNOSIS — Z1231 Encounter for screening mammogram for malignant neoplasm of breast: Secondary | ICD-10-CM | POA: Diagnosis not present

## 2017-03-23 DIAGNOSIS — K3184 Gastroparesis: Secondary | ICD-10-CM | POA: Diagnosis not present

## 2017-03-23 DIAGNOSIS — R110 Nausea: Secondary | ICD-10-CM | POA: Diagnosis not present

## 2017-03-23 DIAGNOSIS — Z0000 Encounter for general adult medical examination without abnormal findings: Secondary | ICD-10-CM

## 2017-04-10 ENCOUNTER — Other Ambulatory Visit: Payer: Self-pay | Admitting: Family Medicine

## 2017-04-15 ENCOUNTER — Other Ambulatory Visit: Payer: Self-pay | Admitting: Family Medicine

## 2017-04-27 ENCOUNTER — Encounter: Payer: Self-pay | Admitting: Family Medicine

## 2017-04-27 ENCOUNTER — Ambulatory Visit (INDEPENDENT_AMBULATORY_CARE_PROVIDER_SITE_OTHER): Payer: BLUE CROSS/BLUE SHIELD | Admitting: Family Medicine

## 2017-04-27 VITALS — BP 110/66 | HR 80 | Temp 97.9°F | Resp 18 | Wt 224.8 lb

## 2017-04-27 DIAGNOSIS — M4696 Unspecified inflammatory spondylopathy, lumbar region: Secondary | ICD-10-CM | POA: Diagnosis not present

## 2017-04-27 DIAGNOSIS — M5410 Radiculopathy, site unspecified: Secondary | ICD-10-CM | POA: Diagnosis not present

## 2017-04-27 DIAGNOSIS — M47816 Spondylosis without myelopathy or radiculopathy, lumbar region: Secondary | ICD-10-CM

## 2017-04-27 DIAGNOSIS — F3162 Bipolar disorder, current episode mixed, moderate: Secondary | ICD-10-CM | POA: Diagnosis not present

## 2017-04-27 MED ORDER — HYDROCODONE-ACETAMINOPHEN 5-325 MG PO TABS: 1.0000 | ORAL_TABLET | Freq: Four times a day (QID) | ORAL | 0 refills | Status: DC | PRN

## 2017-04-27 MED ORDER — METHOCARBAMOL 500 MG PO TABS: 500.0000 mg | ORAL_TABLET | Freq: Three times a day (TID) | ORAL | 1 refills | Status: DC | PRN

## 2017-04-27 MED ORDER — METHYLPREDNISOLONE ACETATE 80 MG/ML IJ SUSP
80.0000 mg | Freq: Once | INTRAMUSCULAR | Status: AC
  Administered 2017-04-27: 80 mg via INTRAMUSCULAR

## 2017-04-27 NOTE — Patient Instructions (Addendum)
MRI lumbar spine Schedule 30 minute for Ingrown toenail removal  F/U 2 MONTHS FOR WEIGHT

## 2017-04-27 NOTE — Addendum Note (Signed)
Addended by: Donne Anon on: 04/27/2017 12:19 PM   Modules accepted: Orders

## 2017-04-27 NOTE — Progress Notes (Signed)
   Subjective:    Patient ID: Monica Stein, female    DOB: May 18, 1976, 41 y.o.   MRN: [id number]  Patient presents for Back Pain (a few days)  Pt here with back pain, has chronic back pain, xray in June, showed mild curvature, MRI from 2004 showed mild lumbar facet arthritis, no stenosis, othwerise normal  she has gained 8lbs since her visit in May. Pain mostly across the middle.The mostly on the left side and radiates to her left leg Doing hosuework over the weekend and then pain started Started Took tylenol for pain  Has constant pain, if she sits too long toes will go numb, PAIN WITH walking,  Has multiple flares  No she's had back pain for many years we have treated conservatively  Taking Saxenda- but weight up 8lbs since May, states she is not eating any different, but has been eating out some time.    She also has ingrown toenails on the right side that she's been scheduled for  Review Of Systems:  GEN- denies fatigue, fever, weight loss,weakness, recent illness HEENT- denies eye drainage, change in vision, nasal discharge, CVS- denies chest pain, palpitations RESP- denies SOB, cough, wheeze ABD- denies N/V, change in stools, abd pain GU- denies dysuria, hematuria, dribbling, incontinence MSK- + joint pain, muscle aches, injury Neuro- denies headache, dizziness, syncope, seizure activity       Objective:    BP 110/66 (BP Location: Right Arm, Patient Position: Sitting, Cuff Size: Large)   Pulse 80   Temp 97.9 F (36.6 C) (Oral)   Resp 18   Wt 224 lb 12.8 oz (102 kg)   BMI 36.28 kg/m  GEN- NAD, alert and oriented x3 CVS- RRR, no murmur RESP-CTAB MSK- TTP lumbar spine, Left paraspinals > Right, + SLR left side, decresed ROM spine, good ROM hips, pain with IR left hip Neuro- normal tone, DTR symmetric LE, antalgic gait, strength equal bilat, sensation grossly in tact  EXT- No edema Pulses- Radial, DP- 2+        Assessment & Plan:      Problem List Items  Addressed This Visit      Unprioritized   Back pain with left-sided radiculopathy - Primary    Has some facet arthritis, recent xray did not show any acute problem but recurrent radicular pain, concern for possible nerve impingment, obtain MRI lumbar spine Is given Depo-Medrol 80 mg which helps. 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 Also given a prescription for hydrocodone she does get some itching but with the Benadryl as needed.      Relevant Medications   HYDROcodone-acetaminophen (NORCO) 5-325 MG tablet   methocarbamol (ROBAXIN) 500 MG tablet    Other Visit Diagnoses    Facet arthritis of lumbar region (HCC)       Relevant Medications   HYDROcodone-acetaminophen (NORCO) 5-325 MG tablet   methocarbamol (ROBAXIN) 500 MG tablet      Note: This dictation was prepared with Dragon dictation along with smaller phrase technology. Any transcriptional errors that result from this process are unintentional.

## 2017-04-27 NOTE — Assessment & Plan Note (Signed)
Has some facet arthritis, recent xray did not show any acute problem but recurrent radicular pain, concern for possible nerve impingment, obtain MRI lumbar spine Is given Depo-Medrol 80 mg which helps. 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 Also given a prescription for hydrocodone she does get some itching but with the Benadryl as needed.

## 2017-05-04 ENCOUNTER — Telehealth: Payer: Self-pay | Admitting: Family Medicine

## 2017-05-04 DIAGNOSIS — M47816 Spondylosis without myelopathy or radiculopathy, lumbar region: Secondary | ICD-10-CM

## 2017-05-04 DIAGNOSIS — M5410 Radiculopathy, site unspecified: Secondary | ICD-10-CM

## 2017-05-04 NOTE — Telephone Encounter (Signed)
Insurance has declined MRI of lumbar spine.  PCP says refer to Ortho for further eval.  Pt made aware and is asking to be sent to [facility].

## 2017-05-09 ENCOUNTER — Inpatient Hospital Stay: Admission: RE | Admit: 2017-05-09 | Payer: BLUE CROSS/BLUE SHIELD | Source: Ambulatory Visit

## 2017-05-13 DIAGNOSIS — M5106 Intervertebral disc disorders with myelopathy, lumbar region: Secondary | ICD-10-CM | POA: Diagnosis not present

## 2017-05-13 DIAGNOSIS — M545 Low back pain: Secondary | ICD-10-CM | POA: Diagnosis not present

## 2017-05-21 DIAGNOSIS — M545 Low back pain: Secondary | ICD-10-CM | POA: Diagnosis not present

## 2017-05-27 DIAGNOSIS — M5106 Intervertebral disc disorders with myelopathy, lumbar region: Secondary | ICD-10-CM | POA: Diagnosis not present

## 2017-05-27 DIAGNOSIS — M797 Fibromyalgia: Secondary | ICD-10-CM | POA: Diagnosis not present

## 2017-05-27 DIAGNOSIS — M545 Low back pain: Secondary | ICD-10-CM | POA: Diagnosis not present

## 2017-06-02 ENCOUNTER — Other Ambulatory Visit: Payer: Self-pay | Admitting: Family Medicine

## 2017-07-09 ENCOUNTER — Other Ambulatory Visit: Payer: Self-pay | Admitting: *Deleted

## 2017-07-09 MED ORDER — TOPIRAMATE 50 MG PO TABS: 50.0000 mg | ORAL_TABLET | Freq: Two times a day (BID) | ORAL | 2 refills | Status: DC

## 2017-07-09 NOTE — Addendum Note (Signed)
Addended by: Phillips OdorSIX, Shawntavia Saunders H on: 07/09/2017 11:54 AM   Modules accepted: Orders

## 2017-07-20 DIAGNOSIS — F3162 Bipolar disorder, current episode mixed, moderate: Secondary | ICD-10-CM | POA: Diagnosis not present

## 2017-08-11 ENCOUNTER — Other Ambulatory Visit: Payer: Self-pay | Admitting: *Deleted

## 2017-08-11 MED ORDER — FAMCICLOVIR 250 MG PO TABS: ORAL_TABLET | ORAL | 3 refills | Status: DC

## 2017-08-19 DIAGNOSIS — F3162 Bipolar disorder, current episode mixed, moderate: Secondary | ICD-10-CM | POA: Diagnosis not present

## 2017-09-04 ENCOUNTER — Other Ambulatory Visit: Payer: Self-pay | Admitting: Family Medicine

## 2017-09-10 DIAGNOSIS — F3162 Bipolar disorder, current episode mixed, moderate: Secondary | ICD-10-CM | POA: Diagnosis not present

## 2017-10-01 DIAGNOSIS — K581 Irritable bowel syndrome with constipation: Secondary | ICD-10-CM | POA: Diagnosis not present

## 2017-10-01 DIAGNOSIS — K3184 Gastroparesis: Secondary | ICD-10-CM | POA: Diagnosis not present

## 2017-11-06 DIAGNOSIS — F3162 Bipolar disorder, current episode mixed, moderate: Secondary | ICD-10-CM | POA: Diagnosis not present

## 2018-03-01 ENCOUNTER — Telehealth: Payer: Self-pay | Admitting: *Deleted

## 2018-03-01 NOTE — Telephone Encounter (Signed)
Received request from pharmacy for PA on Saxenda.   PA submitted.   Dx: E66.09- obesity   

## 2018-03-01 NOTE — Telephone Encounter (Signed)
Your information has been submitted to [employer] [place]. [employer] [place] will review the request and fax you a determination directly, typically within 3 business days of your submission once all necessary information is received.  If [employer] [place] has not responded in 3 business days or if you have any questions about your submission, contact [employer]  at [phone]. 

## 2018-03-03 NOTE — Telephone Encounter (Signed)
[facility]   Advised that Bernie CoveySaxenda has been approved. Will fax approval to office.

## 2018-03-04 MED ORDER — LIRAGLUTIDE -WEIGHT MANAGEMENT 18 MG/3ML ~~LOC~~ SOPN: 3.0000 mg | PEN_INJECTOR | Freq: Every day | SUBCUTANEOUS | 3 refills | Status: DC

## 2018-03-04 NOTE — Telephone Encounter (Signed)
Received PA determination via fax.   PA [id number] approved 03/01/2018- 07/04/2018.  Pharmacy made aware.

## 2018-03-04 NOTE — Addendum Note (Signed)
Addended by: Phillips OdorSIX, CHRISTINA H on: 03/04/2018 04:38 PM   Modules accepted: Orders

## 2018-04-01 DIAGNOSIS — F3162 Bipolar disorder, current episode mixed, moderate: Secondary | ICD-10-CM | POA: Diagnosis not present

## 2018-04-29 DIAGNOSIS — F3162 Bipolar disorder, current episode mixed, moderate: Secondary | ICD-10-CM | POA: Diagnosis not present

## 2018-05-08 ENCOUNTER — Telehealth: Payer: Self-pay

## 2018-05-08 NOTE — Telephone Encounter (Signed)
PA approved for latuda 40mg  thru 05/02/2021

## 2018-05-10 ENCOUNTER — Other Ambulatory Visit: Payer: Self-pay | Admitting: Psychiatry

## 2018-05-10 ENCOUNTER — Other Ambulatory Visit: Payer: Self-pay | Admitting: Family Medicine

## 2018-05-10 DIAGNOSIS — Z1231 Encounter for screening mammogram for malignant neoplasm of breast: Secondary | ICD-10-CM

## 2018-05-10 DIAGNOSIS — F3162 Bipolar disorder, current episode mixed, moderate: Secondary | ICD-10-CM | POA: Diagnosis not present

## 2018-05-11 LAB — BASIC METABOLIC PANEL WITH GFR
BUN: 14 mg/dL (ref 7–25)
CALCIUM: 9 mg/dL (ref 8.6–10.2)
CHLORIDE: 106 mmol/L (ref 98–110)
CO2: 25 mmol/L (ref 20–32)
Creat: 1.08 mg/dL (ref 0.50–1.10)
GFR, EST AFRICAN AMERICAN: 73 mL/min/{1.73_m2} (ref >=60)
GFR, Est Non African American: 63 mL/min/{1.73_m2} (ref >=60)
Glucose, Bld: 115 mg/dL — ABNORMAL HIGH (ref 65–99)
Potassium: 4.2 mmol/L (ref 3.5–5.3)
Sodium: 136 mmol/L (ref 135–146)

## 2018-05-11 LAB — LIPID PANEL
CHOLESTEROL: 194 mg/dL (ref <200)
HDL: 42 mg/dL — AB (ref >50)
LDL Cholesterol (Calc): 113 mg/dL (calc) — ABNORMAL HIGH
NON-HDL CHOLESTEROL (CALC): 152 mg/dL — AB (ref <130)
Total CHOL/HDL Ratio: 4.6 (calc) (ref <5.0)
Triglycerides: 276 mg/dL — ABNORMAL HIGH (ref <150)

## 2018-05-11 LAB — FERRITIN: FERRITIN: 61 ng/mL (ref 16–232)

## 2018-05-11 LAB — CBC WITH DIFFERENTIAL/PLATELET
BASOS ABS: 51 {cells}/uL (ref 0–200)
BASOS PCT: 0.7 %
EOS PCT: 1.8 %
Eosinophils Absolute: 131 cells/uL (ref 15–500)
HEMATOCRIT: 41.6 % (ref 35.0–45.0)
HEMOGLOBIN: 14.3 g/dL (ref 11.7–15.5)
LYMPHS ABS: 1869 {cells}/uL (ref 850–3900)
MCH: 30.5 pg (ref 27.0–33.0)
MCHC: 34.4 g/dL (ref 32.0–36.0)
MCV: 88.7 fL (ref 80.0–100.0)
MONOS PCT: 8.7 %
MPV: 9.7 fL (ref 7.5–12.5)
NEUTROS ABS: 4614 {cells}/uL (ref 1500–7800)
Neutrophils Relative %: 63.2 %
Platelets: 203 10*3/uL (ref 140–400)
RBC: 4.69 10*6/uL (ref 3.80–5.10)
RDW: 13.4 % (ref 11.0–15.0)
Total Lymphocyte: 25.6 %
WBC mixed population: 635 cells/uL (ref 200–950)
WBC: 7.3 10*3/uL (ref 3.8–10.8)

## 2018-05-13 DIAGNOSIS — F429 Obsessive-compulsive disorder, unspecified: Secondary | ICD-10-CM | POA: Insufficient documentation

## 2018-05-14 ENCOUNTER — Other Ambulatory Visit: Payer: Self-pay | Admitting: Psychiatry

## 2018-05-19 ENCOUNTER — Ambulatory Visit (INDEPENDENT_AMBULATORY_CARE_PROVIDER_SITE_OTHER): Payer: BLUE CROSS/BLUE SHIELD | Admitting: Psychiatry

## 2018-05-19 DIAGNOSIS — F3162 Bipolar disorder, current episode mixed, moderate: Secondary | ICD-10-CM | POA: Diagnosis not present

## 2018-05-19 DIAGNOSIS — F411 Generalized anxiety disorder: Secondary | ICD-10-CM

## 2018-05-19 MED ORDER — MIRTAZAPINE 15 MG PO TABS: 15.0000 mg | ORAL_TABLET | Freq: Every day | ORAL | 1 refills | Status: DC

## 2018-05-19 MED ORDER — LURASIDONE HCL 40 MG PO TABS: 60.0000 mg | ORAL_TABLET | Freq: Every day | ORAL | 1 refills | Status: DC

## 2018-05-19 MED ORDER — RISPERIDONE 1 MG PO TABS: 2.0000 mg | ORAL_TABLET | Freq: Every day | ORAL | 1 refills | Status: DC

## 2018-05-19 MED ORDER — CITALOPRAM HYDROBROMIDE 10 MG PO TABS: 10.0000 mg | ORAL_TABLET | Freq: Every day | ORAL | 1 refills | Status: DC

## 2018-05-19 MED ORDER — PROPRANOLOL HCL 20 MG PO TABS: 20.0000 mg | ORAL_TABLET | Freq: Two times a day (BID) | ORAL | 1 refills | Status: DC

## 2018-05-19 MED ORDER — ALPRAZOLAM 0.5 MG PO TABS: 0.5000 mg | ORAL_TABLET | Freq: Every evening | ORAL | 1 refills | Status: DC | PRN

## 2018-05-19 NOTE — Progress Notes (Signed)
[facility]  Patient ID: Monica Stein,  MRN: [id number]  PCP: Salley Scarlet, MD  Date of Evaluation: 05/19/2018 Time spent:20 minutes   HISTORY/CURRENT STATUS: HPI patient is a 42 year old white female.  Last seen 04/29/2018.  Her diagnoses include bipolar with suicidal thoughts, OCD, anxiety with panic attacks, possible psychosis, and restless leg syndrome.  At her last visit depression was about the same.  No manic symptoms.  She did have some mild increased anxiety with panic attacks.  Due to weight gain we discontinued the Seroquel and started her on Latuda 20 mg a day for a week and then 40 mg.  We continued the other medications.   she was supposed to start  Slow Fe twice daily.    Patient has had an increase in anxiety since the last visit.  She has some symptoms of a panic attack including chest pain nonradiating but she has negative shortness of breath, occasional heart racing, and no sweats.  She is also had some manic symptoms recently.  Depression is the same.  OCD is the same.  She is also had insomnia since she has been off of Seroquel.  She admits in the past to passive suicidal thoughts. none at present.  We will increase her Latuda to 60 mg a day.  Advised to see her family physician regarding her chest pain.  She is to make sure her PCP has copies of the 2019 labs.  Will consider repeating a CBC and a ferritin level in 3 months.  We will also start her on Remeron for sleep.           Individual Medical History/ Review of Systems: Changes? :No  Allergies: Hydrocodone-acetaminophen and Oxycodone-acetaminophen  Current Medications:  Current Outpatient Medications:  .  acetaminophen (TYLENOL) 500 MG tablet, Take 1,000 mg by mouth every 6 (six) hours as needed for mild pain. , Disp: , Rfl:  .  Albuterol Sulfate (PROAIR RESPICLICK) 108 (90 Base) MCG/ACT AEPB, Inhale 2 puffs into the lungs every 4 (four) hours as needed., Disp: 1 each, Rfl: 2 .   ALPRAZolam (XANAX) 0.5 MG tablet, TAKE 1 TABLET BY MOUTH TWICE A DAY AS NEEDED FOR ANXIETY, Disp: 45 tablet, Rfl: 1 .  BD PEN NEEDLE NANO U/F 32G X 4 MM MISC, USE AS DIRECTED WITH SAXENDA., Disp: 100 each, Rfl: 1 .  citalopram (CELEXA) 10 MG tablet, Take 10 mg by mouth daily. , Disp: , Rfl:  .  famciclovir (FAMVIR) 250 MG tablet, TAKE 1 TABLET (250 MG TOTAL) BY MOUTH 2 (TWO) TIMES DAILY., Disp: 180 tablet, Rfl: 3 .  ferrous sulfate 325 (65 FE) MG tablet, Take 325 mg by mouth 2 (two) times daily with a meal., Disp: , Rfl:  .  hydrochlorothiazide (MICROZIDE) 12.5 MG capsule, TAKE 1 CAPSULE BY MOUTH EVERY DAY, Disp: 90 capsule, Rfl: 0 .  HYDROcodone-acetaminophen (NORCO) 5-325 MG tablet, Take 1 tablet by mouth every 6 (six) hours as needed for moderate pain., Disp: 30 tablet, Rfl: 0 .  LINZESS 290 MCG CAPS capsule, TAKE 1 CAPSULE (290 MCG TOTAL) BY MOUTH DAILY BEFORE BREAKFAST., Disp: 30 capsule, Rfl: 8 .  Liraglutide -Weight Management (SAXENDA) 18 MG/3ML SOPN, Inject 3 mg into the skin daily., Disp: 15 mL, Rfl: 3 .  lurasidone (LATUDA) 40 MG TABS tablet, Take 40 mg by mouth daily., Disp: , Rfl:  .  methocarbamol (ROBAXIN) 500 MG tablet, Take 1 tablet (500 mg total) by mouth every 8 (eight) hours as needed for  muscle spasms., Disp: 30 tablet, Rfl: 1 .  Multiple Vitamin (MULTIVITAMIN WITH MINERALS) TABS tablet, Take 1 tablet by mouth daily., Disp: , Rfl:  .  Omega-3 Fatty Acids (FISH OIL) 1000 MG CPDR, Take 1 capsule by mouth 2 (two) times daily., Disp: , Rfl:  .  pantoprazole (PROTONIX) 40 MG tablet, Take 1 tablet (40 mg total) by mouth daily., Disp: 30 tablet, Rfl: 2 .  Probiotic Product (PROBIOTIC PO), Take by mouth. Nature's Bounty, Disp: , Rfl:  .  propranolol (INDERAL) 20 MG tablet, Take 20 mg by mouth 2 (two) times daily., Disp: , Rfl:  .  QUEtiapine (SEROQUEL) 400 MG tablet, Take 400 mg by mouth at bedtime. 1/2 qhs x 1 wk then 1 hs, Disp: , Rfl:  .  risperiDONE (RISPERDAL) 1 MG tablet, Take 1  mg by mouth at bedtime. 2 po qhs, Disp: , Rfl:  .  SUMAtriptan (IMITREX) 100 MG tablet, TAKE 1 TAB BY MOUTH EVERY 2 HRS AS NEEDED FOR MIGRAINE. MAY REPEAT IN 2 HRS IF HEADACHE PERSISTS, Disp: 10 tablet, Rfl: 1 .  topiramate (TOPAMAX) 50 MG tablet, Take 1 tablet (50 mg total) by mouth 2 (two) times daily., Disp: 180 tablet, Rfl: 2 .  Valbenazine Tosylate (INGREZZA) 80 MG CAPS, Take 80 mg by mouth daily., Disp: , Rfl:  Medication Side Effects: None  Family Medical/ Social History: Changes? No  MENTAL HEALTH EXAM:  There were no vitals taken for this visit.There is no height or weight on file to calculate BMI.  General Appearance: Casual  Eye Contact:  Good  Speech:  Normal Rate  Volume:  Normal  Mood:  Depressed  Affect:  Appropriate  Thought Process:  Goal Directed  Orientation:  Full (Time, Place, and Person)  Thought Content: Logical   Suicidal Thoughts:  No  Homicidal Thoughts:  No  Memory normal  Judgement:  Good  Insight:  Negative and Good  Psychomotor Activity:  Normal  Concentration:  Concentration: Good  Recall:  Good  Fund of Knowledge: Good  Language: Good  Akathisia:  No  AIMS (if indicated): not done  Assets:  Desire for Improvement  ADL's:  Intact  Cognition: WNL  Prognosis:  Good    DIAGNOSES: bipolar  anxiety RECOMMENDATIONS: remeron for sleep Increase latuda to 60mg /day Recheck 1 month    [employer], PA-C

## 2018-05-21 ENCOUNTER — Telehealth: Payer: Self-pay

## 2018-05-21 NOTE — Telephone Encounter (Signed)
error 

## 2018-05-31 DIAGNOSIS — K3184 Gastroparesis: Secondary | ICD-10-CM | POA: Diagnosis not present

## 2018-05-31 DIAGNOSIS — K5904 Chronic idiopathic constipation: Secondary | ICD-10-CM | POA: Diagnosis not present

## 2018-06-14 ENCOUNTER — Ambulatory Visit
Admission: RE | Admit: 2018-06-14 | Discharge: 2018-06-14 | Disposition: A | Discharge status: Home / Self Care | Payer: BLUE CROSS/BLUE SHIELD | Source: Ambulatory Visit | Attending: Family Medicine | Admitting: Family Medicine

## 2018-06-14 DIAGNOSIS — Z1231 Encounter for screening mammogram for malignant neoplasm of breast: Secondary | ICD-10-CM

## 2018-06-16 ENCOUNTER — Ambulatory Visit (INDEPENDENT_AMBULATORY_CARE_PROVIDER_SITE_OTHER): Payer: BLUE CROSS/BLUE SHIELD | Admitting: Psychiatry

## 2018-06-16 DIAGNOSIS — F429 Obsessive-compulsive disorder, unspecified: Secondary | ICD-10-CM

## 2018-06-16 DIAGNOSIS — F411 Generalized anxiety disorder: Secondary | ICD-10-CM

## 2018-06-16 DIAGNOSIS — F3162 Bipolar disorder, current episode mixed, moderate: Secondary | ICD-10-CM | POA: Diagnosis not present

## 2018-06-16 MED ORDER — RISPERIDONE 1 MG PO TABS: 2.0000 mg | ORAL_TABLET | Freq: Every day | ORAL | 1 refills | Status: DC

## 2018-06-16 MED ORDER — CITALOPRAM HYDROBROMIDE 20 MG PO TABS: 20.0000 mg | ORAL_TABLET | Freq: Every day | ORAL | 1 refills | Status: DC

## 2018-06-16 MED ORDER — MIRTAZAPINE 15 MG PO TABS: 15.0000 mg | ORAL_TABLET | Freq: Every day | ORAL | 1 refills | Status: DC

## 2018-06-16 NOTE — Progress Notes (Signed)
[facility]  Patient ID: Monica Stein Overall,  MRN: [id number]  PCP: Salley Scarlet, MD  Date of Evaluation: 06/16/2018 Time spent:20 minutes  Chief Complaint:   HISTORY/CURRENT STATUS: HPI 42 year old white female last seen 05/19/2018.  There is a diagnosis of bipolar disorder with suicidal thoughts, OCD, anxiety with panic attacks, possible psychosis, insomnia.  At her recent visit she had an increase in anxiety, her depression and OCD stayed the same.  Is having some manic symptoms and some insomnia.  Increase Latuda to 60 mg a day and started on Remeron at bedtime. Currently doing better.  Anxiety is better, depression and OCD are better, insomnia is better, no manic symptoms.  She has noticed a head tremor since she went on Latuda from 40 to 60 mg a day    Individual Medical History/ Review of Systems: Changes? :No   Allergies: Hydrocodone-acetaminophen and Oxycodone-acetaminophen  Current Medications:  Current Outpatient Medications:  .  ALPRAZolam (XANAX) 0.5 MG tablet, Take 1 tablet (0.5 mg total) by mouth at bedtime as needed for anxiety., Disp: 30 tablet, Rfl: 1 .  mirtazapine (REMERON) 15 MG tablet, Take 1 tablet (15 mg total) by mouth at bedtime., Disp: 30 tablet, Rfl: 1 .  propranolol (INDERAL) 20 MG tablet, Take 1 tablet (20 mg total) by mouth 2 (two) times daily., Disp: 60 tablet, Rfl: 1 .  risperiDONE (RISPERDAL) 1 MG tablet, Take 2 tablets (2 mg total) by mouth at bedtime. 2 po qhs, Disp: 60 tablet, Rfl: 1 .  acetaminophen (TYLENOL) 500 MG tablet, Take 1,000 mg by mouth every 6 (six) hours as needed for mild pain. , Disp: , Rfl:  .  Albuterol Sulfate (PROAIR RESPICLICK) 108 (90 Base) MCG/ACT AEPB, Inhale 2 puffs into the lungs every 4 (four) hours as needed., Disp: 1 each, Rfl: 2 .  BD PEN NEEDLE NANO U/F 32G X 4 MM MISC, USE AS DIRECTED WITH SAXENDA. (Patient not taking: Reported on 05/19/2018), Disp: 100 each, Rfl: 1 .  citalopram (CELEXA) 20 MG tablet,  Take 1 tablet (20 mg total) by mouth daily., Disp: 30 tablet, Rfl: 1 .  famciclovir (FAMVIR) 250 MG tablet, TAKE 1 TABLET (250 MG TOTAL) BY MOUTH 2 (TWO) TIMES DAILY., Disp: 180 tablet, Rfl: 3 .  ferrous sulfate 325 (65 FE) MG tablet, Take 325 mg by mouth 2 (two) times daily with a meal., Disp: , Rfl:  .  hydrochlorothiazide (MICROZIDE) 12.5 MG capsule, TAKE 1 CAPSULE BY MOUTH EVERY DAY, Disp: 90 capsule, Rfl: 0 .  HYDROcodone-acetaminophen (NORCO) 5-325 MG tablet, Take 1 tablet by mouth every 6 (six) hours as needed for moderate pain., Disp: 30 tablet, Rfl: 0 .  LINZESS 290 MCG CAPS capsule, TAKE 1 CAPSULE (290 MCG TOTAL) BY MOUTH DAILY BEFORE BREAKFAST., Disp: 30 capsule, Rfl: 8 .  Liraglutide -Weight Management (SAXENDA) 18 MG/3ML SOPN, Inject 3 mg into the skin daily. (Patient not taking: Reported on 05/19/2018), Disp: 15 mL, Rfl: 3 .  methocarbamol (ROBAXIN) 500 MG tablet, Take 1 tablet (500 mg total) by mouth every 8 (eight) hours as needed for muscle spasms. (Patient not taking: Reported on 05/19/2018), Disp: 30 tablet, Rfl: 1 .  Multiple Vitamin (MULTIVITAMIN WITH MINERALS) TABS tablet, Take 1 tablet by mouth daily., Disp: , Rfl:  .  Omega-3 Fatty Acids (FISH OIL) 1000 MG CPDR, Take 1 capsule by mouth 2 (two) times daily., Disp: , Rfl:  .  pantoprazole (PROTONIX) 40 MG tablet, Take 1 tablet (40 mg total) by mouth daily.,  Disp: 30 tablet, Rfl: 2 .  Probiotic Product (PROBIOTIC PO), Take by mouth. Nature's Bounty, Disp: , Rfl:  .  SUMAtriptan (IMITREX) 100 MG tablet, TAKE 1 TAB BY MOUTH EVERY 2 HRS AS NEEDED FOR MIGRAINE. MAY REPEAT IN 2 HRS IF HEADACHE PERSISTS, Disp: 10 tablet, Rfl: 1 .  topiramate (TOPAMAX) 50 MG tablet, Take 1 tablet (50 mg total) by mouth 2 (two) times daily., Disp: 180 tablet, Rfl: 2 Medication Side Effects: head and arm tremor  Family Medical/ Social History: Changes? No  MENTAL HEALTH EXAM:  There were no vitals taken for this visit.There is no height or weight on  file to calculate BMI.  General Appearance: Casual  Eye Contact:  Good  Speech:  Clear and Coherent  Volume:  Normal  Mood:  Depressed  Affect:  Appropriate  Thought Process:  Linear  Orientation:  Full (Time, Place, and Person)  Thought Content: Logical   Suicidal Thoughts:  No  Homicidal Thoughts:  No  Memory:  WNL  Judgement:  Good  Insight:  Good  Psychomotor Activity:  Normal  Concentration:  Concentration: Good  Recall:  Good  Fund of Knowledge: Good  Language: Good  Assets:  Desire for Improvement  ADL's:  Intact  Cognition: WNL  Prognosis:  Good  Head tremor  DIAGNOSES:     ICD-10-CM   1. Bipolar 1 disorder, mixed, moderate (HCC) F31.62   2. Obsessive-compulsive disorder, unspecified type F42.9   3. Anxiety state F41.1     Receiving Psychotherapy: No    RECOMMENDATIONS: Patient is doing better.  Main complaint is her head movement.  We will keep her Latuda at 60 mg a day, Remeron 15 mg a day, Celexa 20 mg a day, Xanax 0.5 mg as needed at bedtime, Risperdal 2 mg a day.  Tremor was discussed with Dr. Haywood Lassoaudle advised increasing the propranolol dose increased from 20 in the morning and 20 in the evening to 20 and 40.  I hand wrote her Xanax propranolol and Latuda prescriptions.  We will check her CBC and a ferritin level in January next year.  Recheck 1 months.   Anne Fulay Nollie Shiflett, PA-C

## 2018-06-17 ENCOUNTER — Other Ambulatory Visit: Payer: Self-pay

## 2018-06-17 MED ORDER — RISPERIDONE 2 MG PO TABS: 2.0000 mg | ORAL_TABLET | Freq: Every day | ORAL | 2 refills | Status: DC

## 2018-06-22 ENCOUNTER — Other Ambulatory Visit: Payer: Self-pay

## 2018-06-22 MED ORDER — PROPRANOLOL HCL 20 MG PO TABS: 40.0000 mg | ORAL_TABLET | Freq: Two times a day (BID) | ORAL | 1 refills | Status: DC

## 2018-06-24 ENCOUNTER — Telehealth: Payer: Self-pay | Admitting: *Deleted

## 2018-06-24 NOTE — Telephone Encounter (Signed)
Received request from pharmacy for PA on Saxenda.   Patient has not had appointment >1year. Call placed to patient to make aware that we need to see her in office to document weight.

## 2018-06-24 NOTE — Telephone Encounter (Signed)
Patient returned call and made aware.   States that she is no longer taking the medication as it is not working.   Advised that she still needs OV. Will call back to schedule at later date.

## 2018-07-07 ENCOUNTER — Other Ambulatory Visit: Payer: Self-pay

## 2018-07-07 MED ORDER — PANTOPRAZOLE SODIUM 20 MG PO TBEC: 20.0000 mg | DELAYED_RELEASE_TABLET | Freq: Two times a day (BID) | ORAL | 1 refills | Status: DC

## 2018-07-09 ENCOUNTER — Other Ambulatory Visit: Payer: Self-pay

## 2018-07-09 NOTE — Telephone Encounter (Signed)
Pt needing refill on propranolol but trying to verify with pt the dosage she's on, left voicemail for her to call back with information.

## 2018-07-16 ENCOUNTER — Ambulatory Visit: Payer: BLUE CROSS/BLUE SHIELD | Admitting: Psychiatry

## 2018-07-16 ENCOUNTER — Other Ambulatory Visit: Payer: Self-pay

## 2018-07-16 DIAGNOSIS — F429 Obsessive-compulsive disorder, unspecified: Secondary | ICD-10-CM | POA: Diagnosis not present

## 2018-07-16 DIAGNOSIS — F411 Generalized anxiety disorder: Secondary | ICD-10-CM

## 2018-07-16 DIAGNOSIS — F3175 Bipolar disorder, in partial remission, most recent episode depressed: Secondary | ICD-10-CM

## 2018-07-16 MED ORDER — MIRTAZAPINE 15 MG PO TABS: 15.0000 mg | ORAL_TABLET | Freq: Every day | ORAL | 1 refills | Status: DC

## 2018-07-16 MED ORDER — CITALOPRAM HYDROBROMIDE 20 MG PO TABS: ORAL_TABLET | ORAL | 1 refills | Status: DC

## 2018-07-16 MED ORDER — BENZTROPINE MESYLATE 0.5 MG PO TABS
0.5000 mg | ORAL_TABLET | Freq: Two times a day (BID) | ORAL | 1 refills | Status: DC
Start: 2018-07-16 — End: 2022-06-15

## 2018-07-16 MED ORDER — PROPRANOLOL HCL 20 MG PO TABS: ORAL_TABLET | ORAL | 1 refills | Status: DC

## 2018-07-16 MED ORDER — LURASIDONE HCL 60 MG PO TABS: 60.0000 mg | ORAL_TABLET | Freq: Every day | ORAL | 1 refills | Status: DC

## 2018-07-16 MED ORDER — RISPERIDONE 2 MG PO TABS: 2.0000 mg | ORAL_TABLET | Freq: Every day | ORAL | 2 refills | Status: DC

## 2018-07-16 MED ORDER — ALPRAZOLAM 0.5 MG PO TABS: 0.5000 mg | ORAL_TABLET | Freq: Every evening | ORAL | 1 refills | Status: DC | PRN

## 2018-07-16 NOTE — Progress Notes (Signed)
[facility]  Patient ID: Monica Stein,  MRN: [id number]  PCP: Salley Scarlet, MD  Date of Evaluation: 07/16/2018 Time spent:20 minutes  Chief Complaint:   HISTORY/CURRENT STATUS: HPI patient last seen 06/16/2018.  She was doing better.  We continued her on Latuda Remeron Celexa Xanax Risperdal and we increased the propranolol for head tremor. Currently the same. Depression mild, same. No suicidal thoughts. No manic sx. Generalized body tremor.  Individual Medical History/ Review of Systems: Changes? :No   Allergies: Hydrocodone-acetaminophen and Oxycodone-acetaminophen  Current Medications:  Current Outpatient Medications:  .  ALPRAZolam (XANAX) 0.5 MG tablet, Take 1 tablet (0.5 mg total) by mouth at bedtime as needed for anxiety., Disp: 30 tablet, Rfl: 1 .  citalopram (CELEXA) 20 MG tablet, 1 and 1/2 tab.day, Disp: 45 tablet, Rfl: 1 .  mirtazapine (REMERON) 15 MG tablet, Take 1 tablet (15 mg total) by mouth at bedtime., Disp: 30 tablet, Rfl: 1 .  propranolol (INDERAL) 20 MG tablet, 1 in am and 2 hs, Disp: 90 tablet, Rfl: 1 .  risperiDONE (RISPERDAL) 2 MG tablet, Take 1 tablet (2 mg total) by mouth at bedtime., Disp: 30 tablet, Rfl: 2 .  acetaminophen (TYLENOL) 500 MG tablet, Take 1,000 mg by mouth every 6 (six) hours as needed for mild pain. , Disp: , Rfl:  .  Albuterol Sulfate (PROAIR RESPICLICK) 108 (90 Base) MCG/ACT AEPB, Inhale 2 puffs into the lungs every 4 (four) hours as needed., Disp: 1 each, Rfl: 2 .  BD PEN NEEDLE NANO U/F 32G X 4 MM MISC, USE AS DIRECTED WITH SAXENDA. (Patient not taking: Reported on 05/19/2018), Disp: 100 each, Rfl: 1 .  benztropine (COGENTIN) 0.5 MG tablet, Take 1 tablet (0.5 mg total) by mouth 2 (two) times daily., Disp: 60 tablet, Rfl: 1 .  famciclovir (FAMVIR) 250 MG tablet, TAKE 1 TABLET (250 MG TOTAL) BY MOUTH 2 (TWO) TIMES DAILY., Disp: 180 tablet, Rfl: 3 .  ferrous sulfate 325 (65 FE) MG tablet, Take 325 mg by mouth 2 (two)  times daily with a meal., Disp: , Rfl:  .  hydrochlorothiazide (MICROZIDE) 12.5 MG capsule, TAKE 1 CAPSULE BY MOUTH EVERY DAY, Disp: 90 capsule, Rfl: 0 .  HYDROcodone-acetaminophen (NORCO) 5-325 MG tablet, Take 1 tablet by mouth every 6 (six) hours as needed for moderate pain., Disp: 30 tablet, Rfl: 0 .  LINZESS 290 MCG CAPS capsule, TAKE 1 CAPSULE (290 MCG TOTAL) BY MOUTH DAILY BEFORE BREAKFAST., Disp: 30 capsule, Rfl: 8 .  Liraglutide -Weight Management (SAXENDA) 18 MG/3ML SOPN, Inject 3 mg into the skin daily. (Patient not taking: Reported on 05/19/2018), Disp: 15 mL, Rfl: 3 .  Lurasidone HCl 60 MG TABS, Take 1 tablet (60 mg total) by mouth daily., Disp: 30 tablet, Rfl: 1 .  methocarbamol (ROBAXIN) 500 MG tablet, Take 1 tablet (500 mg total) by mouth every 8 (eight) hours as needed for muscle spasms. (Patient not taking: Reported on 05/19/2018), Disp: 30 tablet, Rfl: 1 .  Multiple Vitamin (MULTIVITAMIN WITH MINERALS) TABS tablet, Take 1 tablet by mouth daily., Disp: , Rfl:  .  Omega-3 Fatty Acids (FISH OIL) 1000 MG CPDR, Take 1 capsule by mouth 2 (two) times daily., Disp: , Rfl:  .  pantoprazole (PROTONIX) 20 MG tablet, Take 1 tablet (20 mg total) by mouth 2 (two) times daily., Disp: 60 tablet, Rfl: 1 .  Probiotic Product (PROBIOTIC PO), Take by mouth. Nature's Bounty, Disp: , Rfl:  .  SUMAtriptan (IMITREX) 100 MG tablet, TAKE 1  TAB BY MOUTH EVERY 2 HRS AS NEEDED FOR MIGRAINE. MAY REPEAT IN 2 HRS IF HEADACHE PERSISTS, Disp: 10 tablet, Rfl: 1 .  topiramate (TOPAMAX) 50 MG tablet, Take 1 tablet (50 mg total) by mouth 2 (two) times daily., Disp: 180 tablet, Rfl: 2 Medication Side Effects: none  Family Medical/ Social History: Changes?no  MENTAL HEALTH EXAM:  There were no vitals taken for this visit.There is no height or weight on file to calculate BMI.  General Appearance: Casual  Eye Contact:  Good  Speech:  Clear and Coherent  Volume:  Normal  Mood:  Depressed  Affect:  Appropriate   Thought Process:  Linear  Orientation:  Full (Time, Place, and Person)  Thought Content: Logical   Suicidal Thoughts:  No  Homicidal Thoughts:  No  Memory:  WNL  Judgement:  Good  Insight:  Good  Psychomotor Activity:  Normal  Concentration:  Concentration: Good  Recall:  Good  Fund of Knowledge: Good  Language: Good  Assets:  Desire for Improvement  ADL's:  Intact  Cognition: WNL  Prognosis:  Good    DIAGNOSES:    ICD-10-CM   1. Bipolar disorder, in partial remission, most recent episode depressed (HCC) F31.75   2. Obsessive-compulsive disorder, unspecified type F42.9   3. GAD (generalized anxiety disorder) F41.1     Receiving Psychotherapy: No    RECOMMENDATIONS: She is doing about the same as she was at her last visit she also supports a whole body tremor.  We will continue Latuda 60 mg, Remeron 15 mg, increase Celexa to 30 mg a day, Xanax 0.5 mg 1 a day, Risperdal 2 mg 1 a day.  And propranolol 20 mg in the morning and 40 in the evening for her tremor.  Also started on Cogentin 0.5 mg twice daily for tremor. I will see the patient back in 1 month at that time she will need a CBC and a ferritin level drawn   Anne Fulay Marylin Lathon, PA-C

## 2018-08-13 ENCOUNTER — Ambulatory Visit: Payer: BLUE CROSS/BLUE SHIELD | Admitting: Psychiatry

## 2018-08-24 IMAGING — CR DG LUMBAR SPINE COMPLETE 4+V
5 series · 5 of 5 positions shown · non-contrast
Comparison: 08/21/2013.

CLINICAL DATA: 41-year-old female with chronic low back pain for
years. No trauma. Initial encounter.

EXAM:
LUMBAR SPINE - COMPLETE 4+ VIEW

[w lumbar spine ap]
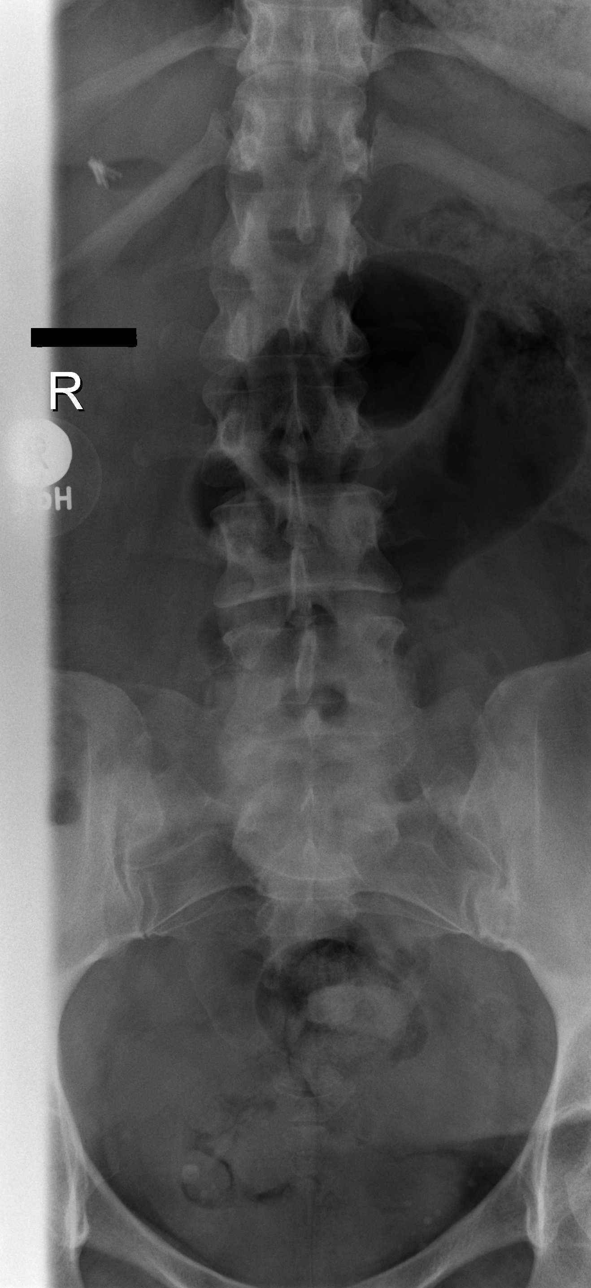

[w lumbar spine obl (1 of 2)]
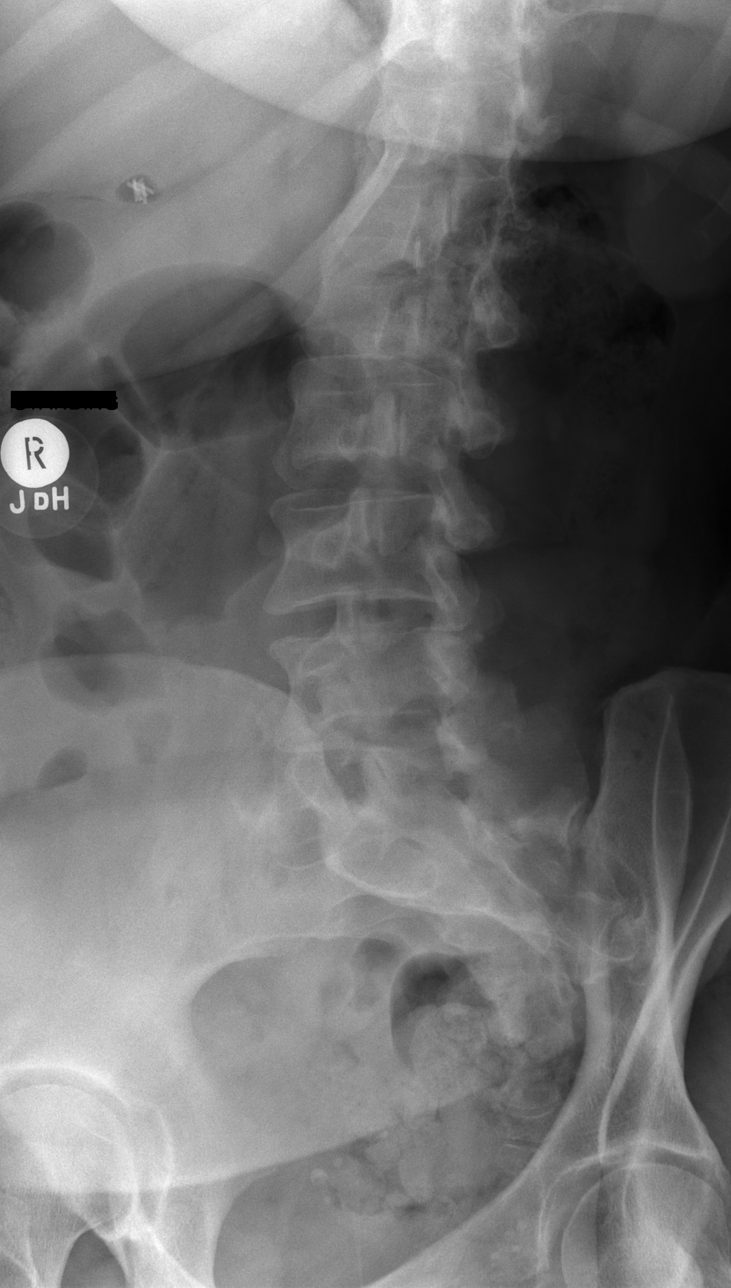

[w lumbar spine obl (2 of 2)]
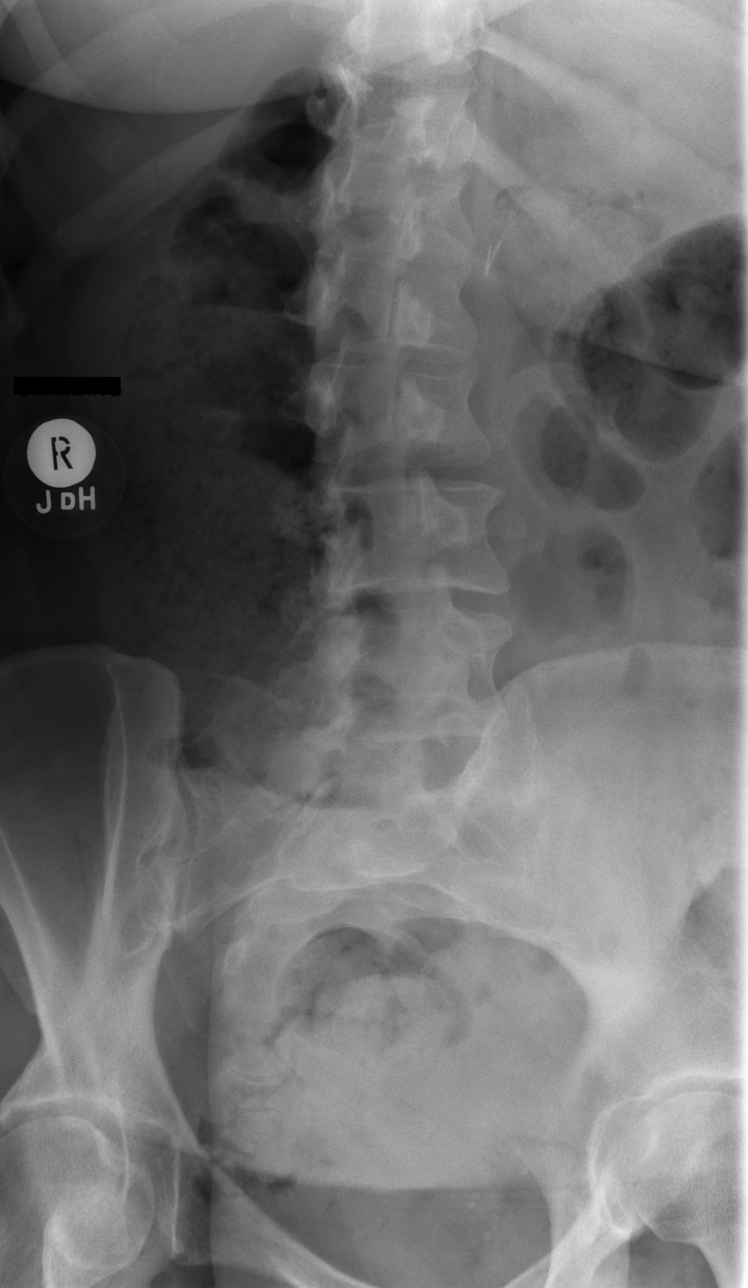

[w lumbar spine lat]
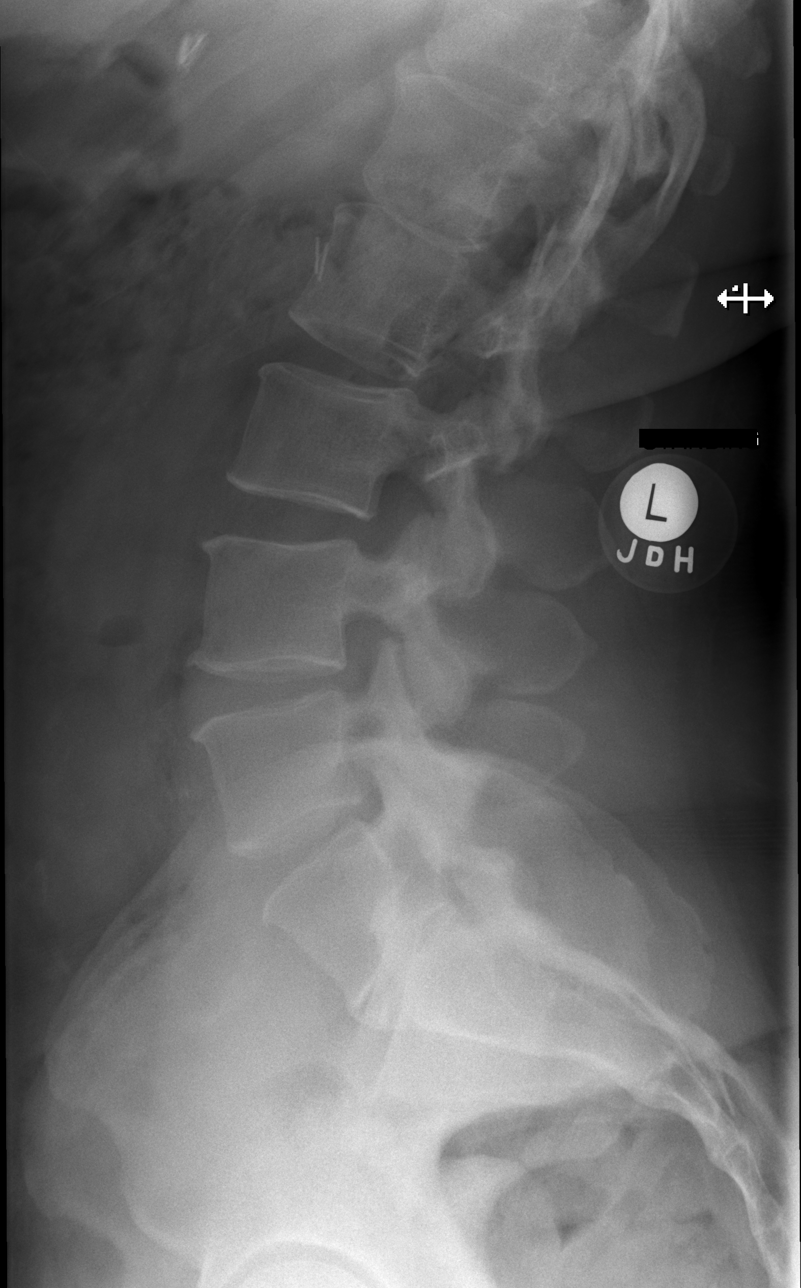

[w lumbar l-5 s-1 spot]
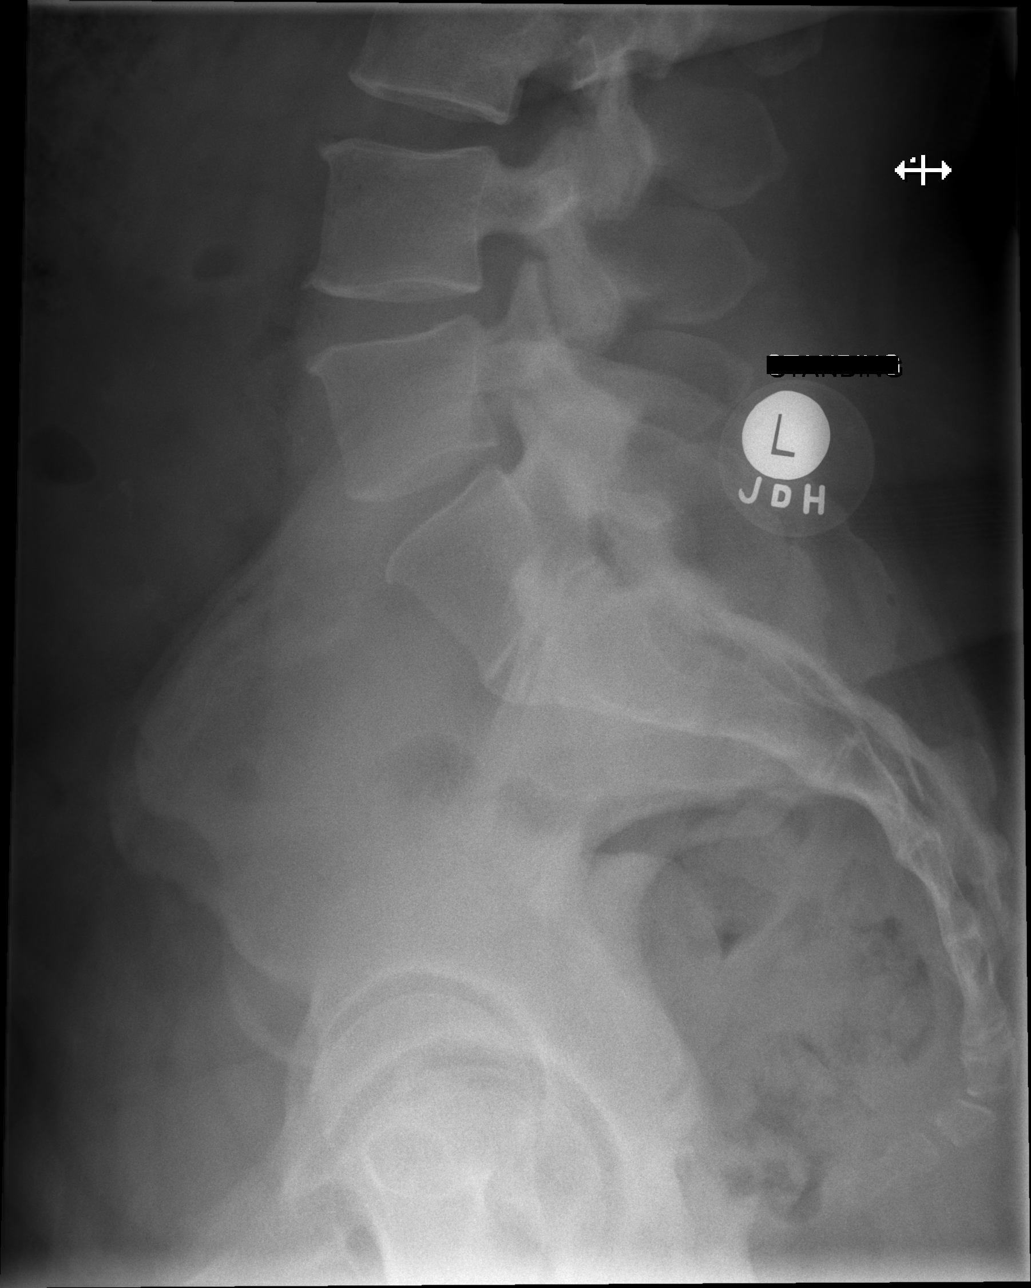

[5 of 5 positions shown; findings below may reference images not displayed]

FINDINGS: Mild curvature lumbar spine convex right.

Transitional vertebra.

No significant disc space narrowing.

No pars defect detected.
IMPRESSION: Mild curvature lumbar spine convex right.

Transitional vertebra lumbosacral junction.

No significant disc space narrowing.

## 2018-09-22 ENCOUNTER — Other Ambulatory Visit: Payer: Self-pay | Admitting: Family Medicine

## 2018-10-20 ENCOUNTER — Other Ambulatory Visit: Payer: Self-pay | Admitting: Family Medicine

## 2020-12-28 ENCOUNTER — Ambulatory Visit (HOSPITAL_COMMUNITY)
Admission: EM | Admit: 2020-12-28 | Discharge: 2020-12-28 | Disposition: A | Discharge status: Home / Self Care | Payer: Self-pay | Attending: Family Medicine | Admitting: Family Medicine

## 2020-12-28 ENCOUNTER — Encounter (HOSPITAL_COMMUNITY): Payer: Self-pay | Admitting: Emergency Medicine

## 2020-12-28 ENCOUNTER — Other Ambulatory Visit: Payer: Self-pay

## 2020-12-28 DIAGNOSIS — L237 Allergic contact dermatitis due to plants, except food: Secondary | ICD-10-CM

## 2020-12-28 MED ORDER — PREDNISONE 10 MG PO TABS: ORAL_TABLET | ORAL | 0 refills | Status: DC

## 2020-12-28 MED ORDER — TRIAMCINOLONE ACETONIDE 0.1 % EX CREA: 1.0000 "application " | TOPICAL_CREAM | Freq: Two times a day (BID) | CUTANEOUS | 0 refills | Status: DC

## 2020-12-28 NOTE — ED Triage Notes (Signed)
Pt presents with poison ivy that come and goes over the past month. States has used multiple OTC medications with no relief.
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
## 2021-12-11 ENCOUNTER — Emergency Department (HOSPITAL_COMMUNITY): Payer: Self-pay

## 2021-12-11 ENCOUNTER — Other Ambulatory Visit: Payer: Self-pay

## 2021-12-11 ENCOUNTER — Emergency Department (HOSPITAL_COMMUNITY)
Admission: EM | Admit: 2021-12-11 | Discharge: 2021-12-12 | Disposition: A | Discharge status: Home / Self Care | Payer: Self-pay | Attending: Student | Admitting: Student

## 2021-12-11 DIAGNOSIS — M545 Low back pain, unspecified: Secondary | ICD-10-CM | POA: Insufficient documentation

## 2021-12-11 LAB — CBC WITH DIFFERENTIAL/PLATELET
Abs Immature Granulocytes: 0.03 10*3/uL (ref 0.00–0.07)
Basophils Absolute: 0.1 10*3/uL (ref 0.0–0.1)
Basophils Relative: 1 %
Eosinophils Absolute: 0.2 10*3/uL (ref 0.0–0.5)
Eosinophils Relative: 2 %
HCT: 44 % (ref 36.0–46.0)
Hemoglobin: 15 g/dL (ref 12.0–15.0)
Immature Granulocytes: 0 %
Lymphocytes Relative: 22 %
Lymphs Abs: 1.7 10*3/uL (ref 0.7–4.0)
MCH: 30.3 pg (ref 26.0–34.0)
MCHC: 34.1 g/dL (ref 30.0–36.0)
MCV: 88.9 fL (ref 80.0–100.0)
Monocytes Absolute: 0.6 10*3/uL (ref 0.1–1.0)
Monocytes Relative: 7 %
Neutro Abs: 5.3 10*3/uL (ref 1.7–7.7)
Neutrophils Relative %: 68 %
Platelets: 209 10*3/uL (ref 150–400)
RBC: 4.95 MIL/uL (ref 3.87–5.11)
RDW: 12.6 % (ref 11.5–15.5)
WBC: 7.9 10*3/uL (ref 4.0–10.5)
nRBC: 0 % (ref 0.0–0.2)

## 2021-12-11 LAB — BASIC METABOLIC PANEL
Anion gap: 8 (ref 5–15)
BUN: 15 mg/dL (ref 6–20)
CO2: 21 mmol/L — ABNORMAL LOW (ref 22–32)
Calcium: 9.2 mg/dL (ref 8.9–10.3)
Chloride: 109 mmol/L (ref 98–111)
Creatinine, Ser: 1.1 mg/dL — ABNORMAL HIGH (ref 0.44–1.00)
GFR, Estimated: >60 mL/min (ref >60)
Glucose, Bld: 112 mg/dL — ABNORMAL HIGH (ref 70–99)
Potassium: 4 mmol/L (ref 3.5–5.1)
Sodium: 138 mmol/L (ref 135–145)

## 2021-12-11 LAB — I-STAT BETA HCG BLOOD, ED (MC, WL, AP ONLY): I-stat hCG, quantitative: <5 m[IU]/mL (ref <5)

## 2021-12-11 MED ORDER — ONDANSETRON HCL 4 MG/2ML IJ SOLN
4.0000 mg | Freq: Once | INTRAMUSCULAR | Status: DC | PRN
  Filled 2021-12-11: qty 2

## 2021-12-11 MED ORDER — MORPHINE SULFATE (PF) 4 MG/ML IV SOLN
4.0000 mg | Freq: Once | INTRAVENOUS | Status: AC
  Administered 2021-12-11: 4 mg via INTRAVENOUS
  Filled 2021-12-11: qty 1

## 2021-12-11 MED ORDER — HYDROMORPHONE HCL 2 MG PO TABS: 2.0000 mg | ORAL_TABLET | Freq: Four times a day (QID) | ORAL | 0 refills | Status: DC | PRN

## 2021-12-11 MED ORDER — LACTATED RINGERS IV BOLUS
500.0000 mL | Freq: Once | INTRAVENOUS | Status: AC
  Administered 2021-12-11: 500 mL via INTRAVENOUS

## 2021-12-11 MED ORDER — HYDROMORPHONE HCL 1 MG/ML IJ SOLN
1.0000 mg | Freq: Once | INTRAMUSCULAR | Status: AC
  Administered 2021-12-11: 1 mg via INTRAVENOUS
  Filled 2021-12-11: qty 1

## 2021-12-11 MED ORDER — METHOCARBAMOL 500 MG PO TABS
1000.0000 mg | ORAL_TABLET | Freq: Once | ORAL | Status: AC
  Administered 2021-12-11: 1000 mg via ORAL
  Filled 2021-12-11: qty 2

## 2021-12-11 MED ORDER — DIAZEPAM 5 MG PO TABS: 5.0000 mg | ORAL_TABLET | Freq: Once | ORAL | Status: DC

## 2021-12-11 MED ORDER — METHOCARBAMOL 750 MG PO TABS: 750.0000 mg | ORAL_TABLET | Freq: Three times a day (TID) | ORAL | 0 refills | Status: DC | PRN

## 2021-12-11 MED ORDER — METHOCARBAMOL 500 MG PO TABS
500.0000 mg | ORAL_TABLET | Freq: Once | ORAL | Status: AC
  Administered 2021-12-11: 500 mg via ORAL
  Filled 2021-12-11: qty 1

## 2021-12-11 MED ORDER — KETOROLAC TROMETHAMINE 15 MG/ML IJ SOLN: 15.0000 mg | Freq: Once | INTRAMUSCULAR | Status: DC

## 2021-12-11 MED ORDER — METHOCARBAMOL 500 MG PO TABS: 1500.0000 mg | ORAL_TABLET | Freq: Once | ORAL | Status: DC

## 2021-12-11 MED ORDER — DIAZEPAM 2 MG PO TABS
2.0000 mg | ORAL_TABLET | Freq: Once | ORAL | Status: AC
  Administered 2021-12-11: 2 mg via ORAL
  Filled 2021-12-11: qty 1

## 2021-12-11 MED ORDER — LIDOCAINE 5 % EX PTCH
1.0000 | MEDICATED_PATCH | CUTANEOUS | Status: DC
  Administered 2021-12-11: 1 via TRANSDERMAL
  Filled 2021-12-11: qty 1

## 2021-12-11 MED ORDER — KETOROLAC TROMETHAMINE 15 MG/ML IJ SOLN
15.0000 mg | Freq: Once | INTRAMUSCULAR | Status: AC
  Administered 2021-12-11: 15 mg via INTRAVENOUS
  Filled 2021-12-11: qty 1

## 2021-12-11 NOTE — ED Provider Notes (Signed)
?[place] ?Provider Note ? ? ?CSN: [id number] ?Arrival date & time: 12/11/21  1713 ? ?  ? ?History ? ?Chief Complaint  ?Patient presents with  ? Back Pain  ? ? ?Monica Stein is a 46 y.o. female with a past medical history of degenerative disc disease of the lumbar spine, sciatica, back pain with left-sided radiculopathy.  She presents today for evaluation of pain diffusely through her lumbar back.  She reports that over the weekend she helped her husband a lot of lumbar and had pain developed then however since then the pain has been progressing and is now severe.  She received 200 mcg of fentanyl by EMS in route without relief in addition to 4 of Zofran. ?Patient denies any fevers.  She denies any personal history of cancer or IV drug use.  She denies any saddle anesthesia or paresthesia.  No changes to bowel or bladder function. ?Her pain is made worse with moving around.  ?HPI ? ?  ? ?Home Medications ?Prior to Admission medications   ?Medication Sig Start Date End Date Taking? Authorizing Provider  ?HYDROmorphone (DILAUDID) 2 MG tablet Take 1 tablet (2 mg total) by mouth every 6 (six) hours as needed for severe pain. 12/11/21  Yes Lorin Glass, PA-C  ?methocarbamol (ROBAXIN) 750 MG tablet Take 1-2 tablets (750-1,500 mg total) by mouth 3 (three) times daily as needed for muscle spasms. 12/11/21  Yes Lorin Glass, PA-C  ?acetaminophen (TYLENOL) 500 MG tablet Take 1,000 mg by mouth every 6 (six) hours as needed for mild pain.     [provider]  ?Albuterol Sulfate (PROAIR RESPICLICK) 123XX123 (90 Base) MCG/ACT AEPB Inhale 2 puffs into the lungs every 4 (four) hours as needed. 09/18/15   Alycia Rossetti, MD  ?ALPRAZolam Duanne Moron) 0.5 MG tablet Take 1 tablet (0.5 mg total) by mouth at bedtime as needed for anxiety. 07/16/18   Comer Locket, PA-C  ?BD PEN NEEDLE NANO U/F 32G X 4 MM MISC USE AS DIRECTED WITH SAXENDA. ?Patient not taking: Reported on  05/19/2018 06/02/17   Alycia Rossetti, MD  ?benztropine (COGENTIN) 0.5 MG tablet Take 1 tablet (0.5 mg total) by mouth 2 (two) times daily. 07/16/18   Shugart, Lissa Hoard, PA-C  ?citalopram (CELEXA) 20 MG tablet 1 and 1/2 tab.day 07/16/18   Comer Locket, PA-C  ?famciclovir (FAMVIR) 250 MG tablet TAKE 1 TABLET BY MOUTH TWICE A DAY 10/20/18   Alycia Rossetti, MD  ?ferrous sulfate 325 (65 FE) MG tablet Take 325 mg by mouth 2 (two) times daily with a meal.    [provider]  ?hydrochlorothiazide (MICROZIDE) 12.5 MG capsule TAKE 1 CAPSULE BY MOUTH EVERY DAY 03/10/17   Alycia Rossetti, MD  ?LINZESS 290 MCG CAPS capsule TAKE 1 CAPSULE (290 MCG TOTAL) BY MOUTH DAILY BEFORE BREAKFAST. 01/09/17   Gatha Mayer, MD  ?Liraglutide -Weight Management (SAXENDA) 18 MG/3ML SOPN Inject 3 mg into the skin daily. ?Patient not taking: Reported on 05/19/2018 03/04/18   Alycia Rossetti, MD  ?Lurasidone HCl 60 MG TABS Take 1 tablet (60 mg total) by mouth daily. 07/16/18   Shugart, Lissa Hoard, PA-C  ?mirtazapine (REMERON) 15 MG tablet Take 1 tablet (15 mg total) by mouth at bedtime. 07/16/18   Comer Locket, PA-C  ?Multiple Vitamin (MULTIVITAMIN WITH MINERALS) TABS tablet Take 1 tablet by mouth daily.    [provider]  ?Omega-3 Fatty Acids (FISH OIL) 1000 MG CPDR Take 1 capsule by mouth  2 (two) times daily. 09/24/15   Alycia Rossetti, MD  ?pantoprazole (PROTONIX) 20 MG tablet Take 1 tablet (20 mg total) by mouth 2 (two) times daily. 07/07/18   Shugart, Lissa Hoard, PA-C  ?predniSONE (DELTASONE) 10 MG tablet Take 6 tabs daily x 2 days, 5 tabs daily x 2 days, 4 tabs daily x 2 days, etc 12/28/20   Volney American, PA-C  ?Probiotic Product (PROBIOTIC PO) Take by mouth. Nature's Bounty    [provider]  ?propranolol (INDERAL) 20 MG tablet 1 in am and 2 hs 07/16/18   Shugart, Lissa Hoard, PA-C  ?risperiDONE (RISPERDAL) 2 MG tablet Take 1 tablet (2 mg total) by mouth at bedtime. 07/16/18   Comer Locket, PA-C  ?SUMAtriptan  (IMITREX) 100 MG tablet TAKE 1 TAB BY MOUTH EVERY 2 HRS AS NEEDED FOR MIGRAINE. MAY REPEAT IN 2 HRS IF HEADACHE PERSISTS 09/04/17   Brooks, Modena Nunnery, MD  ?topiramate (TOPAMAX) 50 MG tablet TAKE 1 TABLET BY MOUTH TWICE A DAY 09/22/18   La Barge, Modena Nunnery, MD  ?triamcinolone cream (KENALOG) 0.1 % Apply 1 application topically 2 (two) times daily. 12/28/20   Volney American, PA-C  ?   ? ?Allergies    ?Hydrocodone-acetaminophen and Oxycodone-acetaminophen   ? ?Review of Systems   ?Review of Systems ?See above ?Physical Exam ?Updated Vital Signs ?BP 116/66   Pulse 78   Temp 98.4 ?F (36.9 ?C) (Oral)   Resp 20   Ht 5\' 6"  (1.676 m)   Wt 101.2 kg   SpO2 98%   BMI 35.99 kg/m?  ?Physical Exam ?Vitals and nursing note reviewed.  ?Constitutional:   ?   Comments: Tearful, appears uncomfortable  ?HENT:  ?   Head: Normocephalic and atraumatic.  ?Eyes:  ?   Conjunctiva/sclera: Conjunctivae normal.  ?Cardiovascular:  ?   Rate and Rhythm: Normal rate and regular rhythm.  ?   Pulses: Normal pulses.  ?   Heart sounds: Normal heart sounds.  ?   Comments: 2+ DP/PT pulses bilaterally.  ?Pulmonary:  ?   Effort: Pulmonary effort is normal. No respiratory distress.  ?   Breath sounds: Normal breath sounds.  ?Abdominal:  ?   General: There is no distension.  ?   Tenderness: There is no abdominal tenderness. There is no guarding.  ?Musculoskeletal:  ?   Cervical back: Normal range of motion and neck supple.  ?   Right lower leg: No edema.  ?   Left lower leg: No edema.  ?   Comments: Diffuse TTP over the bilateral lateral lumbar back and midline.  TTP is not worse on either side. No midline deformity palpated.   ?Skin: ?   General: Skin is warm.  ?Neurological:  ?   Mental Status: She is alert.  ?   Sensory: No sensory deficit.  ?   Motor: No weakness.  ?   Comments: Awake and alert, answers all questions appropriately.  Speech is not slurred. ?5/5 strength ankle dorsiflexion and plantarflexion bilaterally.  ?Psychiatric:     ?   Mood  and Affect: Mood normal.     ?   Behavior: Behavior normal.  ? ? ?ED Results / Procedures / Treatments   ?Labs ?(all labs ordered are listed, but only abnormal results are displayed) ?Labs Reviewed  ?BASIC METABOLIC PANEL - Abnormal; Notable for the following components:  ?    Result Value  ? CO2 21 (*)   ? Glucose, Bld 112 (*)   ? Creatinine, Ser 1.10 (*)   ?  All other components within normal limits  ?CBC WITH DIFFERENTIAL/PLATELET  ?I-STAT BETA HCG BLOOD, ED ([facility], [facility], [facility] ONLY)  ? ? ?EKG ?None ? ?Radiology ?CT Lumbar Spine Wo Contrast ? ?Result Date: 12/11/2021 ?CLINICAL DATA:  Initial evaluation for acute in progressive lower back pain. EXAM: CT LUMBAR SPINE WITHOUT CONTRAST TECHNIQUE: Multidetector CT imaging of the lumbar spine was performed without intravenous contrast administration. Multiplanar CT image reconstructions were also generated. RADIATION DOSE REDUCTION: This exam was performed according to the departmental dose-optimization program which includes automated exposure control, adjustment of the mA and/or kV according to patient size and/or use of iterative reconstruction technique. COMPARISON:  Prior radiograph from 01/20/2017. FINDINGS: Segmentation: Transitional features seen about the lumbosacral junction. Bilateral T12 where ribs are rudimentary. Partially sacralized L5 vertebral body. Alignment: Mild exaggeration of the normal lumbar lordosis. No listhesis. Vertebrae: Vertebral body height maintained without acute or chronic fracture. Visualized sacrum and pelvis intact. SI joints symmetric and normal. No discrete or worrisome osseous lesions. Paraspinal and other soft tissues: Paraspinous soft tissues demonstrate no acute finding. Left kidney is absent. Prior cholecystectomy. Mild aorto bi-iliac atherosclerotic disease. Disc levels: T12-L1: Unremarkable. L1-2:  Unremarkable. L2-3:  Negative interspace.  Minimal facet spurring.  No stenosis. L3-4: Minimal disc bulge with mild facet hypertrophy.  No significant canal or foraminal stenosis. L4-5: Mild intervertebral disc space narrowing with diffuse disc bulge. Mild facet hypertrophy. No significant spinal stenosis. Mild bilateral L4 foraminal narrowing. L5-S1: Rona Ravens

## 2021-12-11 NOTE — ED Provider Triage Note (Signed)
Emergency Medicine Provider Triage Evaluation Note ? ?Monica Stein , a 46 y.o. female  was evaluated in triage.  Pt complains of low back pain that has progressively worsened since Saturday.  Patient has history of low back pain however not this severe.  She denies IV drug use, significant weight loss, history of cancer.  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  Patient was given multiple doses of fentanyl without significant relief.  Denies saddle anesthesia, difficulty controlling bowel or bladder. ? ?Review of Systems  ?Positive: As above ?Negative: As above ? ?Physical Exam  ?BP 125/81   Pulse 93   Temp 98.4 ?F (36.9 ?C) (Oral)   Resp (!) 26   SpO2 100%  ?Gen:   Awake, no distress   ?Resp:  Normal effort  ?MSK:   Moves extremities without difficulty  ?Other:  Significant lumbar spinal process tenderness, lumbar paraspinal muscle tenderness. ? ?Medical Decision Making  ?Medically screening exam initiated at 5:53 PM.  Appropriate orders placed.  Monica Stein was informed that the remainder of the evaluation will be completed by another provider, this initial triage assessment does not replace that evaluation, and the importance of remaining in the ED until their evaluation is complete. ? ? ?  ?Marita Kansas, PA-C ?12/11/21 1754 ? ?

## 2021-12-11 NOTE — ED Triage Notes (Addendum)
BIB EMS from home.  Lumbar back pain.  Helped her husband moving a lot of wood Saturday.  Had pain then.  Pain has progressed. Pt tearful and unable to move well.  No numbness or tingling to extremities. Pain is isolated to lumbar region.   22G RH.  200 mcg Fentanyl given en route with no relief.  4 mg zofran given as well.  ? ?

## 2021-12-11 NOTE — Discharge Instructions (Signed)
Today you received medications that may make you sleepy or impair your ability to make decisions.  For the next 24 hours please do not drive, operate heavy machinery, care for a small child with out another adult present, or perform any activities that may cause harm to you or someone else if you were to fall asleep or be impaired.  ? ?You are being prescribed a medication which may make you sleepy. Please follow up of listed precautions for at least 24 hours after taking one dose. ? ?Please take Tylenol (acetaminophen) to relieve your pain.  You may take tylenol, up to 1,000 mg (two extra strength pills).  Do not take more than 3,000 mg tylenol in a 24 hour period.  Please check all medication labels as many medications such as pain and cold medications may contain tylenol. Please do not drink alcohol while taking this medication.  ? ?

## 2021-12-12 NOTE — ED Notes (Signed)
Patient verbalizes understanding of discharge instructions. Opportunity for questioning and answers were provided. Armband removed by staff, pt discharged from ED.  

## 2022-06-15 ENCOUNTER — Encounter (HOSPITAL_COMMUNITY): Payer: Self-pay

## 2022-06-15 ENCOUNTER — Ambulatory Visit (HOSPITAL_COMMUNITY)
Admission: EM | Admit: 2022-06-15 | Discharge: 2022-06-15 | Disposition: A | Discharge status: Home / Self Care | Payer: Self-pay | Attending: Internal Medicine | Admitting: Internal Medicine

## 2022-06-15 DIAGNOSIS — J0120 Acute ethmoidal sinusitis, unspecified: Secondary | ICD-10-CM

## 2022-06-15 DIAGNOSIS — R0981 Nasal congestion: Secondary | ICD-10-CM

## 2022-06-15 DIAGNOSIS — R051 Acute cough: Secondary | ICD-10-CM

## 2022-06-15 MED ORDER — GUAIFENESIN ER 600 MG PO TB12: 600.0000 mg | ORAL_TABLET | Freq: Two times a day (BID) | ORAL | 0 refills | Status: DC

## 2022-06-15 MED ORDER — AMOXICILLIN-POT CLAVULANATE 875-125 MG PO TABS: 1.0000 | ORAL_TABLET | Freq: Two times a day (BID) | ORAL | 0 refills | Status: DC

## 2022-06-15 MED ORDER — PROMETHAZINE-DM 6.25-15 MG/5ML PO SYRP: 5.0000 mL | ORAL_SOLUTION | Freq: Every evening | ORAL | 0 refills | Status: DC | PRN

## 2022-06-15 MED ORDER — BENZONATATE 100 MG PO CAPS: 100.0000 mg | ORAL_CAPSULE | Freq: Three times a day (TID) | ORAL | 0 refills | Status: DC

## 2022-06-15 NOTE — ED Triage Notes (Signed)
Congestion and sore throat. Pressure in the head and a painful cough. Cough is productive with clear/yellow/green mucus. Slight nose bleeds with blowing her nose.   Onset of symptoms last Friday, over a week.

## 2022-06-15 NOTE — Discharge Instructions (Addendum)
Take Augmentin twice daily for the next 7 days to treat your sinus infection.  Take Mucinex (guaifenesin) every 12 hours as needed for nasal congestion and cough. Increase your water intake while taking Mucinex as this will help it work best in your body.  Take Tessalon Perles every 8 hours as needed for cough.  You may take Promethazine DM for cough at nighttime but do not take this in the daytime or drink/drive as this can make you very sleepy.  You may place a humidifier in your room to add water to the air and help with cough.  You may take Tylenol as needed for pain and fever.  Over-the-counter Voltaren gel may be helpful for pain.  You may apply this as directed to areas of greatest tenderness.  If you develop any new or worsening symptoms or do not improve in the next 2 to 3 days, please return.  If your symptoms are severe, please go to the emergency room.  Follow-up with your primary care provider for further evaluation and management of your symptoms as well as ongoing wellness visits.  I hope you feel better!
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
## 2023-05-07 ENCOUNTER — Encounter (HOSPITAL_COMMUNITY): Payer: Self-pay | Admitting: Emergency Medicine

## 2023-05-07 ENCOUNTER — Ambulatory Visit (HOSPITAL_COMMUNITY)
Admission: EM | Admit: 2023-05-07 | Discharge: 2023-05-07 | Disposition: A | Discharge status: Home / Self Care | Payer: Self-pay | Attending: Family Medicine | Admitting: Family Medicine

## 2023-05-07 DIAGNOSIS — L237 Allergic contact dermatitis due to plants, except food: Secondary | ICD-10-CM

## 2023-05-07 MED ORDER — PREDNISONE 20 MG PO TABS: ORAL_TABLET | ORAL | 0 refills | Status: DC

## 2023-05-07 MED ORDER — TRIAMCINOLONE ACETONIDE 0.1 % EX CREA: 1.0000 | TOPICAL_CREAM | Freq: Two times a day (BID) | CUTANEOUS | 0 refills | Status: DC

## 2023-05-07 MED ORDER — METHYLPREDNISOLONE ACETATE 80 MG/ML IJ SUSP
80.0000 mg | Freq: Once | INTRAMUSCULAR | Status: AC
  Administered 2023-05-07: 80 mg via INTRAMUSCULAR

## 2023-05-07 MED ORDER — METHYLPREDNISOLONE ACETATE 80 MG/ML IJ SUSP
INTRAMUSCULAR | Status: AC
  Filled 2023-05-07: qty 1
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
## 2023-05-07 NOTE — ED Triage Notes (Signed)
Pt needing medications for poison ivy eash.

## 2023-05-07 NOTE — Discharge Instructions (Signed)
You have been given an injection of methylprednisolone 80 mg, steroid.  Take prednisone 20 mg--3 tabs daily x3 days, then 2 tabs daily x3 days, then 1 tab daily x3 days, then one half tab daily x3 days, then stop  Triamcinolone cream--apply 2 times daily to the rash area until better, about 10 to 14 days.

## 2023-12-14 ENCOUNTER — Ambulatory Visit (HOSPITAL_COMMUNITY)
Admission: EM | Admit: 2023-12-14 | Discharge: 2023-12-14 | Disposition: A | Discharge status: Home / Self Care | Payer: Self-pay | Attending: Physician Assistant | Admitting: Physician Assistant

## 2023-12-14 ENCOUNTER — Encounter (HOSPITAL_COMMUNITY): Payer: Self-pay | Admitting: Emergency Medicine

## 2023-12-14 ENCOUNTER — Other Ambulatory Visit: Payer: Self-pay

## 2023-12-14 DIAGNOSIS — L237 Allergic contact dermatitis due to plants, except food: Secondary | ICD-10-CM

## 2023-12-14 MED ORDER — METHYLPREDNISOLONE SODIUM SUCC 125 MG IJ SOLR
80.0000 mg | Freq: Once | INTRAMUSCULAR | Status: AC
  Administered 2023-12-14: 80 mg via INTRAMUSCULAR

## 2023-12-14 MED ORDER — TRIAMCINOLONE ACETONIDE 0.1 % EX CREA: 1.0000 | TOPICAL_CREAM | Freq: Two times a day (BID) | CUTANEOUS | 0 refills | Status: AC

## 2023-12-14 MED ORDER — PREDNISONE 10 MG (21) PO TBPK: ORAL_TABLET | Freq: Every day | ORAL | 0 refills | Status: AC

## 2023-12-14 MED ORDER — METHYLPREDNISOLONE SODIUM SUCC 125 MG IJ SOLR
INTRAMUSCULAR | Status: AC
  Filled 2023-12-14: qty 2

## 2023-12-14 NOTE — Discharge Instructions (Signed)
 We gave you an injection of steroids today.  Please start prednisone  taper tomorrow (12/15/2023).  Do not take NSAIDs with this medication including aspirin, ibuprofen /Advil , naproxen/Aleve.  You can use triamcinolone  cream on specific bothersome area.  Make sure to keep your area clean to prevent infection.  If there are any signs of infection including drainage, redness, pain, fever, swelling you should be seen immediately.  If anything worsens and you have spread of rash, signs of infection, swelling of your throat, shortness of breath, change in your voice need to be seen immediately.
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
## 2023-12-14 NOTE — ED Triage Notes (Signed)
 Rash started Saturday night.  Patient has been working in yard, pulling weeds.  Has not used any medications
